# Patient Record
Sex: Male | Born: 1965 | Race: Black or African American | Hispanic: No | Smoking: Former smoker
Health system: Southern US, Community
[De-identification: ages and names within clinical notes are randomized; demographics above are authoritative.]

## PROBLEM LIST (undated history)

## (undated) DIAGNOSIS — F4310 Post-traumatic stress disorder, unspecified: Secondary | ICD-10-CM

## (undated) DIAGNOSIS — I2510 Atherosclerotic heart disease of native coronary artery without angina pectoris: Secondary | ICD-10-CM

## (undated) DIAGNOSIS — M25562 Pain in left knee: Secondary | ICD-10-CM

## (undated) DIAGNOSIS — I10 Essential (primary) hypertension: Secondary | ICD-10-CM

## (undated) DIAGNOSIS — F32A Depression, unspecified: Secondary | ICD-10-CM

## (undated) DIAGNOSIS — E785 Hyperlipidemia, unspecified: Secondary | ICD-10-CM

## (undated) DIAGNOSIS — I209 Angina pectoris, unspecified: Secondary | ICD-10-CM

## (undated) DIAGNOSIS — M25561 Pain in right knee: Secondary | ICD-10-CM

## (undated) DIAGNOSIS — F419 Anxiety disorder, unspecified: Secondary | ICD-10-CM

## (undated) DIAGNOSIS — F329 Major depressive disorder, single episode, unspecified: Secondary | ICD-10-CM

## (undated) HISTORY — DX: Depression, unspecified: F32.A

## (undated) HISTORY — PX: COSMETIC SURGERY: SHX468

## (undated) HISTORY — DX: Pain in left knee: M25.562

## (undated) HISTORY — DX: Major depressive disorder, single episode, unspecified: F32.9

## (undated) HISTORY — DX: Angina pectoris, unspecified: I20.9

## (undated) HISTORY — DX: Anxiety disorder, unspecified: F41.9

## (undated) HISTORY — DX: Essential (primary) hypertension: I10

## (undated) HISTORY — DX: Hyperlipidemia, unspecified: E78.5

## (undated) HISTORY — DX: Pain in right knee: M25.561

---

## 2003-11-02 ENCOUNTER — Emergency Department (HOSPITAL_COMMUNITY): Admission: EM | Admit: 2003-11-02 | Discharge: 2003-11-02 | Payer: Self-pay

## 2003-11-04 ENCOUNTER — Inpatient Hospital Stay (HOSPITAL_COMMUNITY): Admission: EM | Admit: 2003-11-04 | Discharge: 2003-11-07 | Payer: Self-pay

## 2010-06-12 ENCOUNTER — Emergency Department (HOSPITAL_COMMUNITY): Admission: EM | Admit: 2010-06-12 | Discharge: 2010-06-12 | Payer: Self-pay | Admitting: Family Medicine

## 2011-01-08 NOTE — Op Note (Signed)
NAME:  Sean Strong, Sean Strong                       ACCOUNT NO.:  [id number]   MEDICAL RECORD NO.:  [id number]                   PATIENT TYPE:  INP   LOCATION:  5733                                 FACILITY:  [facility]   PHYSICIAN:  Suzanna Obey, M.D.                    DATE OF BIRTH:  Jul 08, 1966   DATE OF PROCEDURE:  11/04/2003  DATE OF DISCHARGE:                                 OPERATIVE REPORT   PREOPERATIVE DIAGNOSIS:  Left preauricular abscess.   POSTOPERATIVE DIAGNOSIS:  Left preauricular abscess.   SURGICAL PROCEDURE:  Incision and drainage of left preauricular abscess.   ANESTHESIA:  General endotracheal anesthesia.   ESTIMATED BLOOD LOSS:  Approximately 10 mL.   INDICATIONS FOR PROCEDURE:  This is a 45 year old who has had a swelling in  the left preauricular region that has increased in size.  He was treated  with some antibiotics earlier in the week but it has progressed into  swelling and pain.  His swelling has extended out to his left lateral orbit.  He has had a previous preauricular pit abscess in the past which had  incision and drainage.  He was informed of the risks and benefits of the  procedure including bleeding, infection, scarring, recurrence, facial nerve  weakness, and risk of the anesthetic.  All questions were answered and  consent was obtained.   OPERATION:  The patient was taken to the operating room and placed in the  supine position.  After adequate general endotracheal anesthesia, was placed  in a right gaze position, prepped and draped in the usual sterile manner.  An incision with a 15 blade was made through the preauricular pit and a  significant amount of purulent foul smelling material was expressed from the  wound.  It was opened with a hemostat to the extent of the abscessed cavity.  This was done with gentle blunt dissection.  The wound was then copiously  irrigated with saline.  A non-adherent Adaptic pack was placed into the  wound.  It was  dressed.  The patient was awakened and brought to the  recovery room in stable condition.  Counts were correct.                                               Suzanna Obey, M.D.    Cordelia Pen  D:  11/04/2003  T:  11/05/2003  Job:  [id number]

## 2011-01-08 NOTE — Discharge Summary (Signed)
NAME:  Sean Strong, Sean Strong                       ACCOUNT NO.:  [id number]   MEDICAL RECORD NO.:  [id number]                   PATIENT TYPE:  INP   LOCATION:  5733                                 FACILITY:  [facility]   PHYSICIAN:  Suzanna Obey, M.D.                    DATE OF BIRTH:  [birth year]   DATE OF ADMISSION:  11/04/2003  DATE OF DISCHARGE:  11/07/2003                                 DISCHARGE SUMMARY   ADMISSION DIAGNOSIS:  Left preauricular abscess.   DISCHARGE DIAGNOSIS:  Left preauricular abscess.   SURGICAL PROCEDURE:  Incision and drainage of abscess.   HISTORY OF PRESENT ILLNESS:  This is a 45 year old who has had an enlarging  mass in his pre-auricular area that has become significantly uncomfortable  and erythematous.  He has had a previous area like this that had to be  incised and this was many years ago.  It is in the location of a pre-  auricular pit.  He underwent the incision and drainage and was admitted for  intravenous antibiotics.   HOSPITAL COURSE:  He was admitted and remained afebrile on postoperative day  #1.  There was still a significant amount of swelling around the left eye  and face.  The packing was in place.  He did well and did notice significant  decrease in his swelling and pain with the antibiotic therapy.  He remained  afebrile.  On postoperative day #3, he was afebrile, feeling well.  The  swelling had completely resolved around the eye and face.  His edema and  erythema around the wound had decreased.  The drain wick was removed.  He  was discharged to home to follow up in one week.  He was discharged on  Augmentin 875 mg b.i.d. for 10 days.  He is to follow up if he has any  increase in swelling, pain, or redness, or fever.                                                Suzanna Obey, M.D.    Cordelia Pen  D:  11/27/2003  T:  11/27/2003  Job:  [id number]

## 2011-03-09 ENCOUNTER — Inpatient Hospital Stay (INDEPENDENT_AMBULATORY_CARE_PROVIDER_SITE_OTHER)
Admission: RE | Admit: 2011-03-09 | Discharge: 2011-03-09 | Disposition: A | Discharge status: Home / Self Care | Payer: Self-pay | Source: Ambulatory Visit | Attending: Family Medicine | Admitting: Family Medicine

## 2011-03-09 DIAGNOSIS — L0201 Cutaneous abscess of face: Secondary | ICD-10-CM

## 2013-07-24 ENCOUNTER — Ambulatory Visit: Payer: Federal, State, Local not specified - PPO

## 2013-07-24 ENCOUNTER — Ambulatory Visit (INDEPENDENT_AMBULATORY_CARE_PROVIDER_SITE_OTHER): Payer: Federal, State, Local not specified - PPO | Admitting: Internal Medicine

## 2013-07-24 VITALS — BP 142/86 | HR 85 | Temp 98.4°F | Resp 17 | Ht 69.0 in | Wt 222.0 lb

## 2013-07-24 DIAGNOSIS — K089 Disorder of teeth and supporting structures, unspecified: Secondary | ICD-10-CM

## 2013-07-24 DIAGNOSIS — M25569 Pain in unspecified knee: Secondary | ICD-10-CM

## 2013-07-24 DIAGNOSIS — R635 Abnormal weight gain: Secondary | ICD-10-CM

## 2013-07-24 DIAGNOSIS — R079 Chest pain, unspecified: Secondary | ICD-10-CM

## 2013-07-24 DIAGNOSIS — F4310 Post-traumatic stress disorder, unspecified: Secondary | ICD-10-CM

## 2013-07-24 LAB — COMPREHENSIVE METABOLIC PANEL
ALT: 14 U/L (ref 0–53)
Alkaline Phosphatase: 59 U/L (ref 39–117)
Sodium: 140 mEq/L (ref 135–145)
Total Bilirubin: 0.7 mg/dL (ref 0.3–1.2)
Total Protein: 7.4 g/dL (ref 6.0–8.3)

## 2013-07-24 LAB — LIPID PANEL
LDL Cholesterol: 167 mg/dL — ABNORMAL HIGH (ref 0–99)
VLDL: 29 mg/dL (ref 0–40)

## 2013-07-24 LAB — POCT CBC
Granulocyte percent: 55.6 %G (ref 37–80)
HCT, POC: 43 % — AB (ref 43.5–53.7)
POC Granulocyte: 2.7 (ref 2–6.9)
POC LYMPH PERCENT: 34.2 %L (ref 10–50)
Platelet Count, POC: 214 10*3/uL (ref 142–424)
RDW, POC: 16.9 %

## 2013-07-24 LAB — POCT SEDIMENTATION RATE: POCT SED RATE: 30 mm/hr — AB (ref 0–22)

## 2013-07-24 NOTE — Progress Notes (Addendum)
 Subjective:  This chart was scribed for Yuleimy Kretz, MD by Matthew Underwood, ED scribe.  This patient was seen in room [facility] Room 9 and the patient's care was started at 10:00 AM.   Patient ID: Sean Strong, male    DOB: 12/11/1965, 47 y.o.   MRN: [id number]  Chief Complaint  Patient presents with  . Chest Pain    HPI!st [facility] OV   HPI Comments: Sean Strong is a 47 y.o. male who presents complaining of one month of intermittent left-sided chest pain.  Pt reports that pain comes on with exertion and is relieved by resting.  It does not occur while lying in bed.  He states the pain initially feels like gas but is more persistent and radiates into both arms down to his hands.  Pain is associated with nausea and a feeling of "foaming at the mouth."  He denies SOB, diaphoresis, or calf pain or swelling.  Pt denies personal h/o heart problems, HTN or DM.  He admits to family h/o heart problems and DM.  He has not received a CXR.  Pt also notes a loose tooth that he needs to have extracted.  He denies dental abscess.  He has dental insurance but does not have a dentist here.  He also complains of bilateral knee pain and "stiffness" that is worse at night.  He has not been seen for this.  Pt is a [employer] veteran [facility]  He takes Zoloft and trazodone for PTSD, insomnia and depression.  He notes that he has gained a large amount of weight over the past 8 months.  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  He also stopped smoking recently.     There are no active problems to display for this patient.   Past Medical History  Diagnosis Date  . Anxiety   . Depression     Past Surgical History  Procedure Laterality Date  . Cosmetic surgery      No Known Allergies   Prior to Admission medications   Medication Sig Start Date End Date Taking? Authorizing Provider  sertraline (ZOLOFT) 100 MG tablet Take 100 mg by mouth  daily.   Yes Historical Provider, MD  traZODone (DESYREL) 100 MG tablet Take 100 mg by mouth at bedtime.   Yes Historical Provider, MD   History    Social History Main Topics  . Smoking status: QUIT  . Smokeless tobacco: Not on file  . Alcohol Use: No  . Drug Use: No  . Sexual Activity: No      Review of Systems  Constitutional: Positive for unexpected weight change (weight gain). Negative for fever and diaphoresis.  HENT: Positive for dental problem.   Respiratory: Negative for shortness of breath.   Cardiovascular: Positive for chest pain. Negative for leg swelling.  Gastrointestinal: Positive for nausea.  Musculoskeletal: Positive for arthralgias.       Objective:   Physical Exam  Nursing note and vitals reviewed. Constitutional: He is oriented to person, place, and time. He appears well-developed and well-nourished. No distress.  HENT:  Head: Normocephalic and atraumatic.  Mouth/Throat: Oropharynx is clear and moist. No oropharyngeal exudate.  Eyes: Conjunctivae and EOM are normal. Pupils are equal, round, and reactive to light.  Neck: No thyromegaly present.  Cardiovascular: Normal rate, regular rhythm and normal heart sounds.   No murmur heard. No carotid bruits  Pulmonary/Chest: Effort normal and breath sounds normal. No respiratory   distress. He has no wheezes. He has no rales.  Musculoskeletal: He exhibits no edema.  Neurological: He is alert and oriented to person, place, and time.  Skin: Skin is warm and dry. No rash noted.  Psychiatric: He has a normal mood and affect. His behavior is normal.     Filed Vitals:   07/24/13 0903  BP: 142/86  Pulse: 85  Temp: 98.4 F (36.9 C)  TempSrc: Oral  Resp: 17  Height: 5' 9" (1.753 m)  Weight: 222 lb (100.699 kg)  SpO2: 95%    [facility] reading (PRIMARY) by  Dr. Therisa Mennella=NAD EKG=ST-T changes suggesting ischemia  Results for orders placed in visit on 07/24/13  POCT CBC      Result Value Range   WBC 4.8  4.6 -  10.2 K/uL   Lymph, poc 1.6  0.6 - 3.4   POC LYMPH PERCENT 34.2  10 - 50 %L   MID (cbc) 0.5  0 - 0.9   POC MID % 10.2  0 - 12 %M   POC Granulocyte 2.7  2 - 6.9   Granulocyte percent 55.6  37 - 80 %G   RBC 5.60  4.69 - 6.13 M/uL   Hemoglobin 13.4 (*) 14.1 - 18.1 g/dL   HCT, POC 43.0 (*) 43.5 - 53.7 %   MCV 76.8 (*) 80 - 97 fL   MCH, POC 23.9 (*) 27 - 31.2 pg   MCHC 31.2 (*) 31.8 - 35.4 g/dL   RDW, POC 16.9     Platelet Count, POC 214  142 - 424 K/uL   MPV 8.3  0 - 99.8 fL       Assessment & Plan:  Chest pain consistent with angina--ref to Dr Ganji for further eval tomorrow Poor dentition-ref to dentist  Labs pending  I have completed the patient encounter in its entirety as documented by the scribe, with editing by me where necessary. Samara Stankowski P. Renne Platts, M.D.   

## 2013-07-24 NOTE — Patient Instructions (Signed)
Dr Yates Decamp cardiologist will call you with appt

## 2013-07-31 ENCOUNTER — Encounter (HOSPITAL_COMMUNITY): Payer: Self-pay | Admitting: General Practice

## 2013-07-31 ENCOUNTER — Ambulatory Visit (HOSPITAL_COMMUNITY)
Admission: RE | Admit: 2013-07-31 | Discharge: 2013-08-01 | Disposition: A | Discharge status: Home / Self Care | Payer: Federal, State, Local not specified - PPO | Source: Ambulatory Visit | Attending: Cardiology | Admitting: Cardiology

## 2013-07-31 ENCOUNTER — Encounter (HOSPITAL_COMMUNITY)
Admission: RE | Disposition: A | Discharge status: Home / Self Care | Payer: Self-pay | Source: Ambulatory Visit | Attending: Cardiology

## 2013-07-31 DIAGNOSIS — Z87891 Personal history of nicotine dependence: Secondary | ICD-10-CM | POA: Insufficient documentation

## 2013-07-31 DIAGNOSIS — F329 Major depressive disorder, single episode, unspecified: Secondary | ICD-10-CM | POA: Insufficient documentation

## 2013-07-31 DIAGNOSIS — Z955 Presence of coronary angioplasty implant and graft: Secondary | ICD-10-CM

## 2013-07-31 DIAGNOSIS — I2510 Atherosclerotic heart disease of native coronary artery without angina pectoris: Secondary | ICD-10-CM | POA: Insufficient documentation

## 2013-07-31 DIAGNOSIS — Z9861 Coronary angioplasty status: Secondary | ICD-10-CM

## 2013-07-31 DIAGNOSIS — I209 Angina pectoris, unspecified: Secondary | ICD-10-CM | POA: Insufficient documentation

## 2013-07-31 DIAGNOSIS — K089 Disorder of teeth and supporting structures, unspecified: Secondary | ICD-10-CM

## 2013-07-31 DIAGNOSIS — E785 Hyperlipidemia, unspecified: Secondary | ICD-10-CM | POA: Insufficient documentation

## 2013-07-31 DIAGNOSIS — F3289 Other specified depressive episodes: Secondary | ICD-10-CM | POA: Insufficient documentation

## 2013-07-31 DIAGNOSIS — F4310 Post-traumatic stress disorder, unspecified: Secondary | ICD-10-CM | POA: Insufficient documentation

## 2013-07-31 DIAGNOSIS — Z7982 Long term (current) use of aspirin: Secondary | ICD-10-CM | POA: Insufficient documentation

## 2013-07-31 HISTORY — DX: Atherosclerotic heart disease of native coronary artery without angina pectoris: I25.10

## 2013-07-31 HISTORY — PX: LEFT HEART CATHETERIZATION WITH CORONARY ANGIOGRAM: SHX5451

## 2013-07-31 HISTORY — PX: PERCUTANEOUS CORONARY STENT INTERVENTION (PCI-S): SHX6016

## 2013-07-31 HISTORY — PX: PERCUTANEOUS CORONARY STENT INTERVENTION (PCI-S): SHX5485

## 2013-07-31 HISTORY — PX: CARDIAC CATHETERIZATION: SHX172

## 2013-07-31 LAB — PROTIME-INR
INR: 1.01 (ref 0.00–1.49)
Prothrombin Time: 13.1 seconds (ref 11.6–15.2)

## 2013-07-31 LAB — POCT I-STAT 3, ART BLOOD GAS (G3+)
Acid-base deficit: 4 mmol/L — ABNORMAL HIGH (ref 0.0–2.0)
Bicarbonate: 20.8 mEq/L (ref 20.0–24.0)
TCO2: 22 mmol/L (ref 0–100)
pH, Arterial: 7.358 (ref 7.350–7.450)

## 2013-07-31 LAB — POCT ACTIVATED CLOTTING TIME
Activated Clotting Time: 222 seconds
Activated Clotting Time: 237 seconds
Activated Clotting Time: 268 seconds

## 2013-07-31 SURGERY — LEFT HEART CATHETERIZATION WITH CORONARY ANGIOGRAM
Anesthesia: LOCAL

## 2013-07-31 MED ORDER — TICAGRELOR 90 MG PO TABS
90.0000 mg | ORAL_TABLET | Freq: Two times a day (BID) | ORAL | Status: DC
  Administered 2013-07-31 – 2013-08-01 (×2): 90 mg via ORAL
  Filled 2013-07-31 (×3): qty 1

## 2013-07-31 MED ORDER — SODIUM CHLORIDE 0.9 % IV SOLN: 250.0000 mL | INTRAVENOUS | Status: DC | PRN

## 2013-07-31 MED ORDER — ATORVASTATIN CALCIUM 40 MG PO TABS
40.0000 mg | ORAL_TABLET | Freq: Every day | ORAL | Status: DC
  Administered 2013-07-31: 40 mg via ORAL
  Filled 2013-07-31 (×2): qty 1

## 2013-07-31 MED ORDER — ACETAMINOPHEN 325 MG PO TABS
650.0000 mg | ORAL_TABLET | ORAL | Status: DC | PRN
  Administered 2013-07-31: 650 mg via ORAL
  Filled 2013-07-31: qty 2

## 2013-07-31 MED ORDER — SODIUM CHLORIDE 0.9 % IV SOLN
INTRAVENOUS | Status: DC
  Administered 2013-07-31: 07:00:00 via INTRAVENOUS

## 2013-07-31 MED ORDER — MIDAZOLAM HCL 2 MG/2ML IJ SOLN
INTRAMUSCULAR | Status: AC
  Filled 2013-07-31: qty 2

## 2013-07-31 MED ORDER — SODIUM CHLORIDE 0.9 % IJ SOLN: 3.0000 mL | INTRAMUSCULAR | Status: DC | PRN

## 2013-07-31 MED ORDER — NITROGLYCERIN 0.2 MG/ML ON CALL CATH LAB
INTRAVENOUS | Status: AC
  Filled 2013-07-31: qty 1

## 2013-07-31 MED ORDER — HEPARIN (PORCINE) IN NACL 2-0.9 UNIT/ML-% IJ SOLN
INTRAMUSCULAR | Status: AC
  Filled 2013-07-31: qty 1000

## 2013-07-31 MED ORDER — ASPIRIN 81 MG PO CHEW
81.0000 mg | CHEWABLE_TABLET | ORAL | Status: AC
  Administered 2013-07-31: 81 mg via ORAL

## 2013-07-31 MED ORDER — NITROGLYCERIN 0.4 MG SL SUBL: 0.4000 mg | SUBLINGUAL_TABLET | SUBLINGUAL | Status: DC | PRN

## 2013-07-31 MED ORDER — HEPARIN SODIUM (PORCINE) 1000 UNIT/ML IJ SOLN
INTRAMUSCULAR | Status: AC
  Filled 2013-07-31: qty 1

## 2013-07-31 MED ORDER — LIDOCAINE HCL (PF) 1 % IJ SOLN
INTRAMUSCULAR | Status: AC
  Filled 2013-07-31: qty 30

## 2013-07-31 MED ORDER — SODIUM CHLORIDE 0.9 % IV SOLN: 1.0000 mL/kg/h | INTRAVENOUS | Status: AC

## 2013-07-31 MED ORDER — SODIUM CHLORIDE 0.9 % IJ SOLN
3.0000 mL | Freq: Two times a day (BID) | INTRAMUSCULAR | Status: DC
  Administered 2013-07-31: 18:00:00 3 mL via INTRAVENOUS

## 2013-07-31 MED ORDER — TRAZODONE HCL 100 MG PO TABS
100.0000 mg | ORAL_TABLET | Freq: Every day | ORAL | Status: DC
  Administered 2013-07-31: 100 mg via ORAL
  Filled 2013-07-31 (×2): qty 1

## 2013-07-31 MED ORDER — CARVEDILOL 6.25 MG PO TABS
6.2500 mg | ORAL_TABLET | Freq: Two times a day (BID) | ORAL | Status: DC
  Administered 2013-07-31 – 2013-08-01 (×2): 6.25 mg via ORAL
  Filled 2013-07-31 (×4): qty 1

## 2013-07-31 MED ORDER — ASPIRIN 81 MG PO CHEW
81.0000 mg | CHEWABLE_TABLET | Freq: Every day | ORAL | Status: DC
  Administered 2013-08-01: 12:00:00 81 mg via ORAL
  Filled 2013-07-31: qty 1

## 2013-07-31 MED ORDER — SODIUM CHLORIDE 0.9 % IJ SOLN: 3.0000 mL | Freq: Two times a day (BID) | INTRAMUSCULAR | Status: DC

## 2013-07-31 MED ORDER — TICAGRELOR 90 MG PO TABS
90.0000 mg | ORAL_TABLET | Freq: Two times a day (BID) | ORAL | Status: DC
  Filled 2013-07-31: qty 1

## 2013-07-31 MED ORDER — TICAGRELOR 90 MG PO TABS
ORAL_TABLET | ORAL | Status: AC
  Filled 2013-07-31: qty 1

## 2013-07-31 MED ORDER — VERAPAMIL HCL 2.5 MG/ML IV SOLN
INTRAVENOUS | Status: AC
  Filled 2013-07-31: qty 2

## 2013-07-31 MED ORDER — ONDANSETRON HCL 4 MG/2ML IJ SOLN: 4.0000 mg | Freq: Four times a day (QID) | INTRAMUSCULAR | Status: DC | PRN

## 2013-07-31 MED ORDER — HYDROMORPHONE HCL PF 2 MG/ML IJ SOLN
INTRAMUSCULAR | Status: AC
  Filled 2013-07-31: qty 1

## 2013-07-31 MED ORDER — ADENOSINE 12 MG/4ML IV SOLN
16.0000 mL | Freq: Once | INTRAVENOUS | Status: DC
  Filled 2013-07-31: qty 16

## 2013-07-31 MED ORDER — ALPRAZOLAM 0.25 MG PO TABS
0.2500 mg | ORAL_TABLET | Freq: Two times a day (BID) | ORAL | Status: DC | PRN
  Administered 2013-07-31: 13:00:00 0.25 mg via ORAL
  Filled 2013-07-31: qty 1

## 2013-07-31 MED ORDER — ASPIRIN 81 MG PO CHEW
CHEWABLE_TABLET | ORAL | Status: AC
  Filled 2013-07-31: qty 1

## 2013-07-31 NOTE — H&P (View-Only) (Signed)
Subjective:  This chart was scribed for Sean Sia, MD by Sean Strong, ED scribe.  This patient was seen in room [facility] Room 9 and the patient's care was started at 10:00 AM.   Patient ID: Sean Strong, male    DOB: Dec 17, 1965, 47 y.o.   MRN: [id number]  Chief Complaint  Patient presents with  . Chest Pain    HPI!st [facility] OV   HPI Comments: Sean Strong is a 47 y.o. male who presents complaining of one month of intermittent left-sided chest pain.  Pt reports that pain comes on with exertion and is relieved by resting.  It does not occur while lying in bed.  He states the pain initially feels like gas but is more persistent and radiates into both arms down to his hands.  Pain is associated with nausea and a feeling of "foaming at the mouth."  He denies SOB, diaphoresis, or calf pain or swelling.  Pt denies personal h/o heart problems, HTN or DM.  He admits to family h/o heart problems and DM.  He has not received a CXR.  Pt also notes a loose tooth that he needs to have extracted.  He denies dental abscess.  He has dental insurance but does not have a dentist here.  He also complains of bilateral knee pain and "stiffness" that is worse at night.  He has not been seen for this.  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  He takes Zoloft and trazodone for PTSD, insomnia and depression.  He notes that he has gained a large amount of weight over the past 8 months.  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  He also stopped smoking recently.     There are no active problems to display for this patient.   Past Medical History  Diagnosis Date  . Anxiety   . Depression     Past Surgical History  Procedure Laterality Date  . Cosmetic surgery      No Known Allergies   Prior to Admission medications   Medication Sig Start Date End Date Taking? Authorizing Provider  sertraline (ZOLOFT) 100 MG tablet Take 100 mg by mouth  daily.   Yes Historical Provider, MD  traZODone (DESYREL) 100 MG tablet Take 100 mg by mouth at bedtime.   Yes Historical Provider, MD   History    Social History Main Topics  . Smoking status: QUIT  . Smokeless tobacco: Not on file  . Alcohol Use: No  . Drug Use: No  . Sexual Activity: No      Review of Systems  Constitutional: Positive for unexpected weight change (weight gain). Negative for fever and diaphoresis.  HENT: Positive for dental problem.   Respiratory: Negative for shortness of breath.   Cardiovascular: Positive for chest pain. Negative for leg swelling.  Gastrointestinal: Positive for nausea.  Musculoskeletal: Positive for arthralgias.       Objective:   Physical Exam  Nursing note and vitals reviewed. Constitutional: He is oriented to person, place, and time. He appears well-developed and well-nourished. No distress.  HENT:  Head: Normocephalic and atraumatic.  Mouth/Throat: Oropharynx is clear and moist. No oropharyngeal exudate.  Eyes: Conjunctivae and EOM are normal. Pupils are equal, round, and reactive to light.  Neck: No thyromegaly present.  Cardiovascular: Normal rate, regular rhythm and normal heart sounds.   No murmur heard. No carotid bruits  Pulmonary/Chest: Effort normal and breath sounds normal. No respiratory  distress. He has no wheezes. He has no rales.  Musculoskeletal: He exhibits no edema.  Neurological: He is alert and oriented to person, place, and time.  Skin: Skin is warm and dry. No rash noted.  Psychiatric: He has a normal mood and affect. His behavior is normal.     Filed Vitals:   07/24/13 0903  BP: 142/86  Pulse: 85  Temp: 98.4 F (36.9 C)  TempSrc: Oral  Resp: 17  Height: 5\' 9"  (1.753 m)  Weight: 222 lb (100.699 kg)  SpO2: 95%    [facility] reading (PRIMARY) by  Sean. Londen Strong=NAD EKG=ST-T changes suggesting ischemia  Results for orders placed in visit on 07/24/13  POCT CBC      Result Value Range   WBC 4.8  4.6 -  10.2 K/uL   Lymph, poc 1.6  0.6 - 3.4   POC LYMPH PERCENT 34.2  10 - 50 %L   MID (cbc) 0.5  0 - 0.9   POC MID % 10.2  0 - 12 %M   POC Granulocyte 2.7  2 - 6.9   Granulocyte percent 55.6  37 - 80 %G   RBC 5.60  4.69 - 6.13 M/uL   Hemoglobin 13.4 (*) 14.1 - 18.1 g/dL   HCT, POC 16.1 (*) 09.6 - 53.7 %   MCV 76.8 (*) 80 - 97 fL   MCH, POC 23.9 (*) 27 - 31.2 pg   MCHC 31.2 (*) 31.8 - 35.4 g/dL   RDW, POC 04.5     Platelet Count, POC 214  142 - 424 K/uL   MPV 8.3  0 - 99.8 fL       Assessment & Plan:  Chest pain consistent with angina--ref to Sean Strong for further eval tomorrow Poor dentition-ref to dentist  Labs pending  I have completed the patient encounter in its entirety as documented by the scribe, with editing by me where necessary. Sean Strong P. Sean Strong, M.D.

## 2013-07-31 NOTE — Progress Notes (Signed)
TR BAND REMOVAL  LOCATION:    right radial  DEFLATED PER PROTOCOL:    yes  TIME BAND OFF / DRESSING APPLIED:    1235   SITE UPON ARRIVAL:    Level 0  SITE AFTER BAND REMOVAL:    Level 0  REVERSE ALLEN'S TEST:     positive  CIRCULATION SENSATION AND MOVEMENT:    Within Normal Limits   yes  COMMENTS:   Tolerated procedure well

## 2013-07-31 NOTE — CV Procedure (Signed)
Procedure performed:  Left heart catheterization including hemodynamic monitoring of the left ventricle, LV gram. Selective right and left coronary arteriography. PTCA and stenting of the proximal and mid LAD with 2 overlapping 4.0 x 20 mm and a 3.5 x 32 mm promos Premier drug-eluting stent. Evaluation of mid LAD stenosis significance by FFR.  Indication: Patient is a 47 year-old [place] American male with history of post traumatic stress disorder, hyperlipidemia was not on any therapy  who presents with exertional angina pectoris of class III,. Patient has  had non invasive testing which was markedly abnormal revealing severe anterior and anteroapical ischemia. With a suspicion of proximal LAD high-grade stenosis, brought to the cardiac catheterization lab to evaluate  coronary anatomy for definitive diagnosis of CAD.  Hemodynamic data: Left ventricular pressure was 106/3 with LVEDP of 10 mm mercury. Aortic pressure was 99/75 with a mean of 87 mm mercury. There was no pressure gradient across the aortic valve.   Left ventricle: Performed in the RAO projection revealed LVEF of 55%. There was NO MR. no wall motion abnormality.  Right coronary artery: The vessel is smooth, normal, Dominant.  Left main coronary artery is large and normal.  Circumflex coronary artery: A large vessel giving origin to a large obtuse marginal 1. There is a moderate sized OM 2, large AV groove branch, no significant stenosis.  LAD:  LAD gives origin to a large diagonal-1.  At the origin of the septal perforator 1, there is a high-grade 99% stenosis of the proximal LAD. The stenosis extends beyond the diagonal 1, and distal to this there is a hazy 50% stenosis in the mid LAD. Mid to distal segment of the LAD shows very mild luminal irregularity.  Impression: High-grade stenosis of the proximal LAD, mid LAD shows a 50% hazy stenosis. The diagonal 1 has mild ostial stenosis of 20%, diagonal 3 has ostial stenosis of 80%,  however it is very small.  Interventional data: Successful PTCA and stenting of the  proximal and mid LAD with 2 overlapping 4.0 x 20 mm and a 3.5 x 32 mm promos Premier drug-eluting stent.  Will need Dual antiplatelet therapy with BRILINTA and ASA 81 mg for at least one year.   Technique of diagnostic cardiac catheterization:  Under sterile precautions using a 6 French right radial  arterial access, a 6 French sheath was introduced into the right radial artery. A 5 [place] Tig 4 catheter was advanced into the ascending aorta selective  right coronary artery and left coronary artery was cannulated and angiography was performed in multiple views. The catheter was pulled back Out of the body over exchange length J-wire.  Same Catheter was used to perform LV gram which was performed in RAO projection. Catheter exchanged out of the body over J-Wire. NO immediate complications noted.  Technique of intervention:  Using a 6 [place] XB LAD 6 [place] guide catheter the left main  coronary  was selected and cannulated. Using heparin IV for anticoagulation, I utilized a cougar XT guidewire and across the left anterior descending coronary artery without any difficulty. I placed the tip of the wire into the distal  coronary artery. Angiography was performed. Then I utilized a 2.5 x 18 mm sprinter Legend balloon , I performed balloon angioplasty at 14 atmospheric pressure x 60 for seconds each.   I proceeded with implantation of a 4.0 x20 mm promos Premier  drug-eluting stent into the proximal LAD coronary artery. The stent was deployed at 10 atmospheric pressure for 69  seconds. After stenting the proximal LAD, multiple views, angiograms was performed to evaluate the mid LAD stenosis. The guidewire was withdrawn, intracoronary nitroglycerin was administered. The mid LAD stenosis appeared to be significant in the AP cranial views at least by 50-60%. Due to the haziness,  decided to proceed with evaluation of the mid LAD  stenosis by FFR in the standard fashion.  ACT was maintained at therapeutic range throughout the procedure. The cougar guidewire was withdrawn and I then introduced Prime wire into the proximal LAD, and then the gently advanced the wire into the distal LAD with moderate amount of difficulty. Immediately after crossing the LAD stenosis the FFR fell from 1.0 to 0.7 , hence the lesion was felt significant even without administration of intravenous adenosine. At this point we decided to stent the mid LAD stenosis and a previously used 2.5 x 15 mm sprinter Legend balloon was advanced into the mid LAD to evaluate the size of the stent that would be needed. The balloon was withdrawn, I then withdrew the Prime wire out of the body due to inability to advance the stent. I readvanced cougar XT guidewire back into the LAD and rest of the procedure was uneventful , I then advanced a 3.5 x 32 mm promos Premier drug-eluting stent into the mid LAD and overlapping with the previously placed proximal LAD stent, the stent was deployed at 8 atmospheric pressure for 60 seconds followed by withdrawal of the same stent balloon just proximal into the stent and overlapping with the previously placed stent, I balloon it at 14 atmospheric pressure for 45 seconds. Intracoronary nitroglycerin was administered. Following this angiography revealed small diagonal 3 branch which had high-grade stenosis to begin with, had very slow flow, however at the end of the procedure the flow improved to TIMI-2 flow which is suspect will improve TIMI-3 flow. The LAD itself looked excellent without any edge dissection. There was no evidence of thrombus.  The guidewire was withdrawn out of the body and the guide catheter was engaged and pulled out of the body over the J-wire the was no immediate complication. Patient tolerated the procedure well. Hemostasis was obtained by applying TR band.  Disposition: Patient will be discharged in morning unless  complications with out-patient follow up.

## 2013-07-31 NOTE — Care Management Note (Addendum)
    Page 1 of 1   08/01/2013     2:18:37 PM   CARE MANAGEMENT NOTE 08/01/2013  Patient:  Sean Strong, Sean Strong   Account Number:  [id number]  Date Initiated:  07/31/2013  Documentation initiated by:  Gi Or Norman  Subjective/Objective Assessment:   47 y.o. male who presents complaining of one month of intermittent left-sided chest pain. //Home with spouse     Action/Plan:   Left heart cath//benefits check for Brilinta.   Anticipated DC Date:  07/31/2013   Anticipated DC Plan:  HOME/SELF CARE      DC Planning Services  CM consult  Medication Assistance      Choice offered to / List presented to:             Status of service:   Medicare Important Message given?   (If response is "NO", the following Medicare IM given date fields will be blank) Date Medicare IM given:   Date Additional Medicare IM given:    Discharge Disposition:    Per UR Regulation:    If discussed at Long Length of Stay Meetings, dates discussed:    Comments:  08/01/13 1130 Oletta Cohn, RN, BSN, NCM [phone] Pt V.A.and utilizes services in [place].  Pt instructed to follow-up with PCP at [facility] to get new Brilinta prescription. 30 day free card will cover pt until he gets to PCP at [facility].  07/31/13 1430 Camellia Wood, RN, BSN, [employer] [phone] Brilinta or the generic of Marden Noble is not covered under member's policy.  However, Plavix and the generic of that is Clopidogrel is covered.  Plavix is $80/copay for 30 day supply and does need auth - number to call is [phone]. Clopidogrel is $15.00 / for either a 30 day supply or a mail order (90day supply).  No prior auth for this one.

## 2013-07-31 NOTE — Interval H&P Note (Signed)
Please see my ov notes also.   History and Physical Interval Note:  07/31/2013 7:46 AM  Sean Strong  has presented today for surgery, with the diagnosis of cad  The various methods of treatment have been discussed with the patient and family. After consideration of risks, benefits and other options for treatment, the patient has consented to  Procedure(s): LEFT HEART CATHETERIZATION WITH CORONARY ANGIOGRAM (N/A) and possible PCI as a surgical intervention .  The patient's history has been reviewed, patient examined, no change in status, stable for surgery.  I have reviewed the patient's chart and labs.  Questions were answered to the patient's satisfaction.   Cath Lab Visit (complete for each Cath Lab visit)  Clinical Evaluation Leading to the Procedure:   ACS: no  Non-ACS:    Anginal Classification: CCS III  Anti-ischemic medical therapy: Maximal Therapy (2 or more classes of medications)  Non-Invasive Test Results: High-risk stress test findings: cardiac mortality >3%/year  Prior CABG: No previous CABG        [facility] R

## 2013-08-01 LAB — CBC
MCH: 24.8 pg — ABNORMAL LOW (ref 26.0–34.0)
Platelets: 202 10*3/uL (ref 150–400)
RBC: 5.4 MIL/uL (ref 4.22–5.81)
RDW: 14.9 % (ref 11.5–15.5)
WBC: 6.2 10*3/uL (ref 4.0–10.5)

## 2013-08-01 LAB — BASIC METABOLIC PANEL
BUN: 9 mg/dL (ref 6–23)
CO2: 21 mEq/L (ref 19–32)
Calcium: 9.2 mg/dL (ref 8.4–10.5)
Chloride: 107 mEq/L (ref 96–112)
Creatinine, Ser: 0.99 mg/dL (ref 0.50–1.35)
GFR calc Af Amer: >90 mL/min (ref >90)
GFR calc non Af Amer: >90 mL/min (ref >90)
Glucose, Bld: 98 mg/dL (ref 70–99)
Sodium: 138 mEq/L (ref 135–145)

## 2013-08-01 MED ORDER — TICAGRELOR 90 MG PO TABS: 90.0000 mg | ORAL_TABLET | Freq: Two times a day (BID) | ORAL | Status: DC

## 2013-08-01 NOTE — Discharge Summary (Addendum)
Physician Discharge Summary  Patient ID: Sean Strong MRN: [id number] DOB/AGE: 1966-06-24 47 y.o.  Admit date: 07/31/2013 Discharge date: 08/01/2013 PCP: Ellamae Sia, MD  Primary Discharge Diagnosis CAD of the native vessel S/P PTCA one vessel, 07/31/2013: PTCA and stenting of the proximal and mid LAD with 2 overlapping 4.0 x 20 mm and a 3.5 x 32 mm promos Premier drug-eluting stent.  Secondary Discharge Diagnosis Hyperlipidemia PTSD  Significant Diagnostic Studies: 07/01/2013: Left ventricle: Performed in the RAO projection revealed LVEF of 55%. There was NO MR. no wall motion abnormality.  Right coronary artery: The vessel is smooth, normal, Dominant.  Left main coronary artery is large and normal.  Circumflex coronary artery: A large vessel giving origin to a large obtuse marginal 1. There is a moderate sized OM 2, large AV groove branch, no significant stenosis.  LAD: LAD gives origin to a large diagonal-1. At the origin of the septal perforator 1, there is a high-grade 99% stenosis of the proximal LAD. The stenosis extends beyond the diagonal 1, and distal to this there is a hazy 50% stenosis in the mid LAD. Mid to distal segment of the LAD shows very mild luminal irregularity.  Impression: High-grade stenosis of the proximal LAD, mid LAD shows a 50% hazy stenosis. The diagonal 1 has mild ostial stenosis of 20%, diagonal 3 has ostial stenosis of 80%, however it is very small.  Interventional data: Successful PTCA and stenting of the proximal and mid LAD with 2 overlapping 4.0 x 20 mm and a 3.5 x 32 mm promos Premier drug-eluting stent. Will need Dual antiplatelet therapy with BRILINTA and ASA 81 mg for at least one year. Mid LAD stenting performed guided by FFR which was 0.70 immediately after crossing the mid LAD stent after proximal LAD was stented.  Hospital Course: Patient was evaluated in the outpatient setting for chest discomfort, underwent recent stress testing 3 days ago  on 07/27/2013 and was found to have severe antral wall ischemia. He was electively scheduled for coronary angiography, found to have high-grade stenosis in the proximal and mid LAD, underwent successful angioplasty. This morning patient states that he heard he feels better, feels like in his chest, slept well. His right radial arterial access site was stable. Hence was felt stable for discharge.  He has insurance issues with BRILINTA, I will give him 30 day supply, will work on getting him the medication through [facility] as he is a veteran, I will follow this closely. He'll keep up his appointment with me in 2 weeks.  Discharge Exam: Blood pressure 135/77, pulse 78, temperature 98.4 F (36.9 C), temperature source Oral, resp. rate 18, height 5\' 9"  (1.753 m), weight 101 kg (222 lb 10.6 oz), SpO2 98.00%.  General appearance: alert, cooperative, appears stated age and no distress Resp: clear to auscultation bilaterally Cardio: regular rate and rhythm, S1, S2 normal, no murmur, click, rub or gallop GI: soft, non-tender; bowel sounds normal; no masses,  no organomegaly Extremities: extremities normal, atraumatic, no cyanosis or edema and Right radial arterial access site has healed well. No hematoma. Good pulse evident. Neurologic: Grossly normal Labs:   Lab Results  Component Value Date   WBC 6.2 08/01/2013   HGB 13.4 08/01/2013   HCT 38.4* 08/01/2013   MCV 71.1* 08/01/2013   PLT 202 08/01/2013    Recent Labs Lab 08/01/13 0558  NA 138  K 3.6  CL 107  CO2 21  BUN 9  CREATININE 0.99  CALCIUM 9.2  GLUCOSE  98    Lipid Panel     Component Value Date/Time   CHOL 230* 07/24/2013 1025   TRIG 146 07/24/2013 1025   HDL 34* 07/24/2013 1025   CHOLHDL 6.8 07/24/2013 1025   VLDL 29 07/24/2013 1025   LDLCALC 167* 07/24/2013 1025    EKG: Normal sinus rhythm, normal intervals, nonspecific T. abnormality anterior leads.    Radiology: Dg Chest 2 View  07/24/2013   CLINICAL DATA:  Chest pain   EXAM: CHEST  2 VIEW  COMPARISON:  None.  FINDINGS: Lungs are clear. Heart size and pulmonary vascularity are normal. No adenopathy. No pneumothorax. No bone lesions.  IMPRESSION: No abnormality noted.   Electronically Signed   By: Bretta Bang M.D.   On: 07/24/2013 11:43    FOLLOW UP PLANS AND APPOINTMENTS Discharge Orders   Future Orders Complete By Expires   Amb Referral to Cardiac Rehabilitation  As directed        Medication List         aspirin 81 MG chewable tablet  Chew 81 mg by mouth daily.     atorvastatin 40 MG tablet  Commonly known as:  LIPITOR  Take 40 mg by mouth daily.     carvedilol 6.25 MG tablet  Commonly known as:  COREG  Take 6.25 mg by mouth 2 (two) times daily with a meal.     nitroGLYCERIN 0.4 MG SL tablet  Commonly known as:  NITROSTAT  Place 0.4 mg under the tongue every 5 (five) minutes as needed for chest pain.     Ticagrelor 90 MG Tabs tablet  Commonly known as:  BRILINTA  Take 1 tablet (90 mg total) by mouth 2 (two) times daily.     traZODone 100 MG tablet  Commonly known as:  DESYREL  Take 100 mg by mouth at bedtime.          Pamella Pert, MD 08/01/2013, 5:20 PM  Pager: [phone] Office: [phone] If no answer: [phone]

## 2013-08-01 NOTE — Progress Notes (Signed)
CARDIAC REHAB PHASE I   PRE:  Rate/Rhythm: 83SR  BP:  Supine:   Sitting: 150/91  Standing:    SaO2:   MODE:  Ambulation: 650 ft   POST:  Rate/Rhythm: 83SR  BP:  Supine:   Sitting: 146/83   Standing:    SaO2:  0920-1017 Pt walked 650 ft with slow steady gait. Tired by end of walk. Has not been able to walk much lately due to feeling bad. Denied CP. Pt discussed PTSD and emotional support given. Feel that pt would really benefit from CRP 2 and he is in agreement for referral. Will refer to [place]. Education completed. Pt knows that he needs to work on diet and ex as he has gained a lot of weight over short period of time. Gave heart healthy diet and ex ed. States ready to make changes.   Luetta Nutting, RN BSN  08/01/2013 10:13 AM

## 2013-08-21 ENCOUNTER — Encounter (HOSPITAL_COMMUNITY)
Admission: RE | Admit: 2013-08-21 | Discharge: 2013-08-21 | Disposition: A | Discharge status: Home / Self Care | Payer: Federal, State, Local not specified - PPO | Source: Ambulatory Visit | Attending: Cardiology | Admitting: Cardiology

## 2013-08-21 NOTE — Progress Notes (Signed)
Cardiac Rehab Medication Review by a Pharmacist  Does the patient  feel that his/her medications are working for him/her?  yes  Has the patient been experiencing any side effects to the medications prescribed?  yes  Does the patient measure his/her own blood pressure or blood glucose at home?  no   Does the patient have any problems obtaining medications due to transportation or finances?   no  Understanding of regimen: good Understanding of indications: good Potential of compliance: good   Pharmacist comments: Sean Strong is a pleasant 47 yo AAM who has a good grasp on his medications and why he takes them.  The only side effects he reports are now resolved.  He had some initial headaches and nosebleeds after his procedure, but none now.  One week ago, he had some sharp pain in his left hand, but that has also resolved.  He will be addressing with the physician about restarting Zoloft 100mg  qAM upon his next visit, but does not currently take.  He is a patient of the [facility] and gets part of his medications through there pharmacy.  He does not monitor his BP at home.  Sean Strong, PharmD Clinical Pharmacist - Resident Pager: [phone] Pharmacy: [phone] 08/21/2013 8:43 AM

## 2013-08-27 ENCOUNTER — Ambulatory Visit (HOSPITAL_COMMUNITY): Payer: Federal, State, Local not specified - PPO

## 2013-08-27 ENCOUNTER — Encounter (HOSPITAL_COMMUNITY)
Admission: RE | Admit: 2013-08-27 | Discharge: 2013-08-27 | Disposition: A | Discharge status: Home / Self Care | Payer: Federal, State, Local not specified - PPO | Source: Ambulatory Visit | Attending: Cardiology | Admitting: Cardiology

## 2013-08-27 ENCOUNTER — Encounter (HOSPITAL_COMMUNITY): Payer: Self-pay

## 2013-08-27 DIAGNOSIS — Z7982 Long term (current) use of aspirin: Secondary | ICD-10-CM | POA: Insufficient documentation

## 2013-08-27 DIAGNOSIS — Z87891 Personal history of nicotine dependence: Secondary | ICD-10-CM | POA: Insufficient documentation

## 2013-08-27 DIAGNOSIS — I2510 Atherosclerotic heart disease of native coronary artery without angina pectoris: Secondary | ICD-10-CM | POA: Insufficient documentation

## 2013-08-27 DIAGNOSIS — I209 Angina pectoris, unspecified: Secondary | ICD-10-CM | POA: Insufficient documentation

## 2013-08-27 DIAGNOSIS — E785 Hyperlipidemia, unspecified: Secondary | ICD-10-CM | POA: Insufficient documentation

## 2013-08-27 DIAGNOSIS — Z5189 Encounter for other specified aftercare: Secondary | ICD-10-CM | POA: Insufficient documentation

## 2013-08-27 NOTE — Progress Notes (Signed)
Pt started cardiac rehab today.  Pt tolerated light exercise without difficulty.  VSS, telemetry-NSR.  Asymptomatic.  Pt oriented to exercise equipment and routine.  Understanding verbalized.  PSYCHOSOCIAL ASSESSMENT  Pt psychosocial assessment reveals no barriers to rehab participation.  Pt quality of life is altered by his physical and mental ability to perform tasks as prior to his illness.  Pt is currently being treated for PTSD and depression by [facility].    Pt exhibits positive coping skills, however lacks family support.   PHQ-11.  Pt reports he is scheduled to resume his zoloft 100mg  however he does not like how it makes him feel.  Pt advised to contact mental health provider to discuss medication dosage.  Dr. Jacinto HalimGanji made aware of QOL scores and pt desire to take ED medication.  Offered emotional support and reassurance.  Will continue to monitor.

## 2013-08-29 ENCOUNTER — Encounter (HOSPITAL_COMMUNITY)
Admission: RE | Admit: 2013-08-29 | Discharge: 2013-08-29 | Disposition: A | Discharge status: Home / Self Care | Payer: Federal, State, Local not specified - PPO | Source: Ambulatory Visit | Attending: Cardiology | Admitting: Cardiology

## 2013-08-29 ENCOUNTER — Ambulatory Visit (HOSPITAL_COMMUNITY): Payer: Federal, State, Local not specified - PPO

## 2013-08-29 NOTE — Progress Notes (Signed)
Pt arrived at cardiac rehab today reporting he is out of carvedilol and brilinta.  Last dose yesterday. Pt reports he has requested refills but not ready. [facility]. Pharmacist reports they are out of stock of Brilinta, will not be available until tomorrow.  The carvedilol is ready for pick up.  pc to Dr. Verl DickerGanji's nurse to arrange for pt to pick up samples of brilinta in the office.  Pt instructed to pick up samples from Dr. Jacinto HalimGanji and pick up carvidilol at pharmacy.  Understanding verbalized

## 2013-08-31 ENCOUNTER — Encounter (HOSPITAL_COMMUNITY)
Admission: RE | Admit: 2013-08-31 | Discharge: 2013-08-31 | Disposition: A | Discharge status: Home / Self Care | Payer: Federal, State, Local not specified - PPO | Source: Ambulatory Visit | Attending: Cardiology | Admitting: Cardiology

## 2013-08-31 ENCOUNTER — Ambulatory Visit (HOSPITAL_COMMUNITY): Payer: Federal, State, Local not specified - PPO

## 2013-08-31 NOTE — Progress Notes (Signed)
[facility] brilinta is ready for pick up. Understanding verbalized

## 2013-08-31 NOTE — Progress Notes (Signed)
Sean Strong says he has had 2 nose bleeds at home this week. Patient reports the  nose bleeds as mild. No problems noted today with exercise at cardiac rehab. Dr Verl DickerGanji's office called and notified.

## 2013-09-03 ENCOUNTER — Ambulatory Visit (HOSPITAL_COMMUNITY): Payer: Federal, State, Local not specified - PPO

## 2013-09-03 ENCOUNTER — Encounter (HOSPITAL_COMMUNITY)
Admission: RE | Admit: 2013-09-03 | Discharge: 2013-09-03 | Disposition: A | Discharge status: Home / Self Care | Payer: Federal, State, Local not specified - PPO | Source: Ambulatory Visit | Attending: Cardiology | Admitting: Cardiology

## 2013-09-04 NOTE — Progress Notes (Addendum)
(  late entry for 09/03/13 1:30pm)  Pt arrived at cardiac rehab reporting he was unable to pick up his brilinta due to $45 copay.  Pt would like to check with his VA benefits to see if they will cover the cost.  Pt states he has enough samples to last a few more days from Dr. Jacinto HalimGanji.  Will continue to monitor.  Spoke to April Dr. Verl DickerGanji's nurse.  Samples are available for pt to pick up at Dr. Verl DickerGanji's office. Pt has appt with Dr. Jacinto HalimGanji later this week and can discuss changing to more affordable option at that time.

## 2013-09-05 ENCOUNTER — Ambulatory Visit (HOSPITAL_COMMUNITY): Payer: Federal, State, Local not specified - PPO

## 2013-09-05 ENCOUNTER — Encounter (HOSPITAL_COMMUNITY)
Admission: RE | Admit: 2013-09-05 | Discharge: 2013-09-05 | Disposition: A | Discharge status: Home / Self Care | Payer: Federal, State, Local not specified - PPO | Source: Ambulatory Visit | Attending: Cardiology | Admitting: Cardiology

## 2013-09-05 NOTE — Progress Notes (Signed)
[name] is supposed to go by Dr Jodi MarbleGangi's office after exercise and pick up Brillinta samples after exercise at cardiac rehab.

## 2013-09-07 ENCOUNTER — Encounter (HOSPITAL_COMMUNITY)
Admission: RE | Admit: 2013-09-07 | Discharge: 2013-09-07 | Disposition: A | Discharge status: Home / Self Care | Payer: Federal, State, Local not specified - PPO | Source: Ambulatory Visit | Attending: Cardiology | Admitting: Cardiology

## 2013-09-07 ENCOUNTER — Ambulatory Visit (HOSPITAL_COMMUNITY): Payer: Federal, State, Local not specified - PPO

## 2013-09-10 ENCOUNTER — Ambulatory Visit (HOSPITAL_COMMUNITY): Payer: Federal, State, Local not specified - PPO

## 2013-09-10 ENCOUNTER — Encounter (HOSPITAL_COMMUNITY)
Admission: RE | Admit: 2013-09-10 | Discharge: 2013-09-10 | Disposition: A | Discharge status: Home / Self Care | Payer: Federal, State, Local not specified - PPO | Source: Ambulatory Visit | Attending: Cardiology | Admitting: Cardiology

## 2013-09-10 NOTE — Progress Notes (Signed)
Pt arrived at cardiac rehab reporting increasing knee pain at rest and with exertion. Pt reports he awoke Saturday morning with increased knee pain, more than usual.  No swelling, edema or redness noted.  Negative homans sign.  Strong pedal and post tibular pulses.  Pt reports increased pain with walking and stair climbing. Pt unable to participate in exercise today.  Pt declined offer to call Dr. Jacinto HalimGanji for appt.  Pt states he plans to [facility], probably tomorrow am when he has copayment available.  Pt instructed to present to ED for sudden worsening discomfort.  Understanding verbalized. Pt also states that he was able to get VA authorization to pay for Brilinta. Pt states he has enough brilinta available to last until the supply arrives from the [facility] pharmacy. Pt instructed to notify Dr. Jacinto HalimGanji if needs brilinta samples, reminded importance of avoiding missed brilinta doses.  Understanding verbalized

## 2013-09-11 ENCOUNTER — Ambulatory Visit (INDEPENDENT_AMBULATORY_CARE_PROVIDER_SITE_OTHER): Payer: Federal, State, Local not specified - PPO | Admitting: Internal Medicine

## 2013-09-11 ENCOUNTER — Ambulatory Visit: Payer: Federal, State, Local not specified - PPO

## 2013-09-11 VITALS — BP 100/60 | HR 80 | Temp 98.2°F | Resp 16 | Ht 68.5 in | Wt 223.0 lb

## 2013-09-11 DIAGNOSIS — M79605 Pain in left leg: Secondary | ICD-10-CM

## 2013-09-11 DIAGNOSIS — IMO0002 Reserved for concepts with insufficient information to code with codable children: Secondary | ICD-10-CM

## 2013-09-11 DIAGNOSIS — M1710 Unilateral primary osteoarthritis, unspecified knee: Secondary | ICD-10-CM

## 2013-09-11 DIAGNOSIS — M79604 Pain in right leg: Secondary | ICD-10-CM

## 2013-09-11 DIAGNOSIS — M79609 Pain in unspecified limb: Secondary | ICD-10-CM

## 2013-09-11 MED ORDER — NAPROXEN 500 MG PO TABS: 500.0000 mg | ORAL_TABLET | Freq: Two times a day (BID) | ORAL | Status: DC

## 2013-09-11 MED ORDER — METHYLPREDNISOLONE ACETATE 80 MG/ML IJ SUSP
120.0000 mg | Freq: Once | INTRAMUSCULAR | Status: AC
  Administered 2013-09-11: 120 mg via INTRAMUSCULAR

## 2013-09-11 NOTE — Patient Instructions (Signed)
Knee Exercises EXERCISES RANGE OF MOTION(ROM) AND STRETCHING EXERCISES These exercises may help you when beginning to rehabilitate your injury. Your symptoms may resolve with or without further involvement from your physician, physical therapist or athletic trainer. While completing these exercises, remember:   Restoring tissue flexibility helps normal motion to return to the joints. This allows healthier, less painful movement and activity.  An effective stretch should be held for at least 30 seconds.  A stretch should never be painful. You should only feel a gentle lengthening or release in the stretched tissue. STRETCH - Knee Extension, Prone  Lie on your stomach on a firm surface, such as a bed or countertop. Place your right / left knee and leg just beyond the edge of the surface. You may wish to place a towel under the far end of your right / left thigh for comfort.  Relax your leg muscles and allow gravity to straighten your knee. Your clinician may advise you to add an ankle weight if more resistance is helpful for you.  You should feel a stretch in the back of your right / left knee. Hold this position for __________ seconds. Repeat __________ times. Complete this stretch __________ times per day. * Your physician, physical therapist or athletic trainer may ask you to add ankle weight to enhance your stretch.  RANGE OF MOTION - Knee Flexion, Active  Lie on your back with both knees straight. (If this causes back discomfort, bend your opposite knee, placing your foot flat on the floor.)  Slowly slide your heel back toward your buttocks until you feel a gentle stretch in the front of your knee or thigh.  Hold for __________ seconds. Slowly slide your heel back to the starting position. Repeat __________ times. Complete this exercise __________ times per day.  STRETCH - Quadriceps, Prone   Lie on your stomach on a firm surface, such as a bed or padded floor.  Bend your right /  left knee and grasp your ankle. If you are unable to reach, your ankle or pant leg, use a belt around your foot to lengthen your reach.  Gently pull your heel toward your buttocks. Your knee should not slide out to the side. You should feel a stretch in the front of your thigh and/or knee.  Hold this position for __________ seconds. Repeat __________ times. Complete this stretch __________ times per day.  STRETCH  Hamstrings, Supine   Lie on your back. Loop a belt or towel over the ball of your right / left foot.  Straighten your right / left knee and slowly pull on the belt to raise your leg. Do not allow the right / left knee to bend. Keep your opposite leg flat on the floor.  Raise the leg until you feel a gentle stretch behind your right / left knee or thigh. Hold this position for __________ seconds. Repeat __________ times. Complete this stretch __________ times per day.  STRENGTHENING EXERCISES These exercises may help you when beginning to rehabilitate your injury. They may resolve your symptoms with or without further involvement from your physician, physical therapist or athletic trainer. While completing these exercises, remember:   Muscles can gain both the endurance and the strength needed for everyday activities through controlled exercises.  Complete these exercises as instructed by your physician, physical therapist or athletic trainer. Progress the resistance and repetitions only as guided.  You may experience muscle soreness or fatigue, but the pain or discomfort you are trying to eliminate should   never worsen during these exercises. If this pain does worsen, stop and make certain you are following the directions exactly. If the pain is still present after adjustments, discontinue the exercise until you can discuss the trouble with your clinician. STRENGTH - Quadriceps, Isometrics  Lie on your back with your right / left leg extended and your opposite knee bent.  Gradually  tense the muscles in the front of your right / left thigh. You should see either your knee cap slide up toward your hip or increased dimpling just above the knee. This motion will push the back of the knee down toward the floor/mat/bed on which you are lying.  Hold the muscle as tight as you can without increasing your pain for __________ seconds.  Relax the muscles slowly and completely in between each repetition. Repeat __________ times. Complete this exercise __________ times per day.  STRENGTH - Quadriceps, Short Arcs   Lie on your back. Place a __________ inch towel roll under your knee so that the knee slightly bends.  Raise only your lower leg by tightening the muscles in the front of your thigh. Do not allow your thigh to rise.  Hold this position for __________ seconds. Repeat __________ times. Complete this exercise __________ times per day.  OPTIONAL ANKLE WEIGHTS: Begin with ____________________, but DO NOT exceed ____________________. Increase in 1 pound/0.5 kilogram increments.  STRENGTH - Quadriceps, Straight Leg Raises  Quality counts! Watch for signs that the quadriceps muscle is working to insure you are strengthening the correct muscles and not "cheating" by substituting with healthier muscles.  Lay on your back with your right / left leg extended and your opposite knee bent.  Tense the muscles in the front of your right / left thigh. You should see either your knee cap slide up or increased dimpling just above the knee. Your thigh may even quiver.  Tighten these muscles even more and raise your leg 4 to 6 inches off the floor. Hold for __________ seconds.  Keeping these muscles tense, lower your leg.  Relax the muscles slowly and completely in between each repetition. Repeat __________ times. Complete this exercise __________ times per day.  STRENGTH - Hamstring, Curls  Lay on your stomach with your legs extended. (If you lay on a bed, your feet may hang over the  edge.)  Tighten the muscles in the back of your thigh to bend your right / left knee up to 90 degrees. Keep your hips flat on the bed/floor.  Hold this position for __________ seconds.  Slowly lower your leg back to the starting position. Repeat __________ times. Complete this exercise __________ times per day.  OPTIONAL ANKLE WEIGHTS: Begin with ____________________, but DO NOT exceed ____________________. Increase in 1 pound/0.5 kilogram increments.  STRENGTH  Quadriceps, Squats  Stand in a door frame so that your feet and knees are in line with the frame.  Use your hands for balance, not support, on the frame.  Slowly lower your weight, bending at the hips and knees. Keep your lower legs upright so that they are parallel with the door frame. Squat only within the range that does not increase your knee pain. Never let your hips drop below your knees.  Slowly return upright, pushing with your legs, not pulling with your hands. Repeat __________ times. Complete this exercise __________ times per day.  STRENGTH - Quadriceps, Wall Slides  Follow guidelines for form closely. Increased knee pain often results from poorly placed feet or knees.  Lean against   a smooth wall or door and walk your feet out 18-24 inches. Place your feet hip-width apart.  Slowly slide down the wall or door until your knees bend __________ degrees.* Keep your knees over your heels, not your toes, and in line with your hips, not falling to either side.  Hold for __________ seconds. Stand up to rest for __________ seconds in between each repetition. Repeat __________ times. Complete this exercise __________ times per day. * Your physician, physical therapist or athletic trainer will alter this angle based on your symptoms and progress. Document Released: 06/23/2005 Document Revised: 11/01/2011 Document Reviewed: 11/21/2008 Exit[employer] Patient Information 2014 [employer].  

## 2013-09-11 NOTE — Progress Notes (Signed)
   Subjective:    Patient ID: Sean Strong, male    DOB: 04/20/1966, 48 y.o.   MRN: [id number]  HPI In cardiac rehab.  48 y.o. Male presents to clinic with right leg pain . Pain worse at night . Is having trouble with walking and standing. Currently in cardiac rehab and was concerned of a possible blood clot. Hasn't noticed any swelling or warmth to the touch, but is having trouble with activities at rehab. Pain had been going on for the past several weeks. Has previous break to this leg. States that left leg is starting to hurt due to compensating for right leg. Pain 8 out of 10. Was told by therapist to be seen for medical treatment before returning for therapy. Has tried aleve for the pain without much relief.  Is in cardiac rehab for a blocked artery which he had stents for. Had stent surgery in Dec of 2014.  Hx of sprain right knee for a while  Walkin with difficulty Review of Systems     Objective:   Physical Exam  Vitals reviewed. Constitutional: He is oriented to person, place, and time. He appears well-developed and well-nourished. He appears distressed.  HENT:  Head: Normocephalic.  Eyes: EOM are normal.  Cardiovascular: Normal rate.   Pulmonary/Chest: Effort normal.  Musculoskeletal:       Right knee: He exhibits abnormal meniscus. He exhibits normal range of motion, no swelling, no effusion, no ecchymosis, no deformity and no erythema. Tenderness found. Medial joint line tenderness noted.       Left knee: He exhibits normal range of motion, no swelling, no effusion, no ecchymosis, no bony tenderness, normal meniscus and no MCL laxity. Tenderness found.  Neurological: He is alert and oriented to person, place, and time. He has normal strength. No cranial nerve deficit or sensory deficit. He exhibits normal muscle tone. Coordination and gait abnormal.  Psychiatric: He has a normal mood and affect. His behavior is normal.     [facility] reading (PRIMARY) by  Sean Strong djd  bilateral       Assessment & Plan:  Refer to ortho/Sean Strong Hinged knee brace Knee exercises Depomedrol im/Naprosyn 500mg  bid 10d

## 2013-09-11 NOTE — Progress Notes (Signed)
   Subjective:    Patient ID: Sean Strong, male    DOB: 07/13/1966, 48 y.o.   MRN: [id number]  HPI    Review of Systems     Objective:   Physical Exam        Assessment & Plan:

## 2013-09-12 ENCOUNTER — Encounter (HOSPITAL_COMMUNITY): Payer: Federal, State, Local not specified - PPO

## 2013-09-12 ENCOUNTER — Ambulatory Visit (HOSPITAL_COMMUNITY): Payer: Federal, State, Local not specified - PPO

## 2013-09-14 ENCOUNTER — Ambulatory Visit (HOSPITAL_COMMUNITY): Payer: Federal, State, Local not specified - PPO

## 2013-09-14 ENCOUNTER — Encounter (HOSPITAL_COMMUNITY)
Admission: RE | Admit: 2013-09-14 | Discharge: 2013-09-14 | Disposition: A | Discharge status: Home / Self Care | Payer: Federal, State, Local not specified - PPO | Source: Ambulatory Visit | Attending: Cardiology | Admitting: Cardiology

## 2013-09-14 NOTE — Progress Notes (Signed)
I have reviewed home exercise with Sean Strong. The patient was advised to walk 2-4 days per week for 10 minutes, 3 times per day, progressing to 15 minutes, 2 times per day until he can walk 30 minutes continuously.  Pt stated he is not currently completing home exercise, but will begin.  Also encouraged patient to continue his PT exercise for his knee.  Pt will also complete one additional day of hand weights outside of CRP II.  Progression of exercise prescription was discussed.  Reviewed THR, pulse, RPE, sign and symptoms, NTG use and when to call 911 or MD.  Pt voiced understanding.  1315  Sean FreestoneKendall Mahin Guardia, MS, ACSM RCEP 09/14/2013 4:20 PM

## 2013-09-14 NOTE — Progress Notes (Signed)
Melton AlarGilbert L Pyeatt 48 y.o. male Nutrition Note Spoke with pt.  Nutrition Plan and Nutrition Survey goals reviewed with pt. Pt is following Step 2 of the Therapeutic Lifestyle Changes diet. Pt asked for the "do's and don'ts of heart healthy eating." Pt concept re: a heart healthy diet clarified. Pt wants to lose wt. Wt loss tips reviewed. Pt asked about wt loss supplements. Supplements discussed. Pt encouraged to discuss wt loss medication if desired with his PCP. Pt expressed understanding of the information reviewed. Pt aware of nutrition education classes offered and is unable to attend nutrition classes.  Nutrition Diagnosis   Food-and nutrition-related knowledge deficit related to lack of exposure to information as related to diagnosis of: ? CVD   Obesity related to excessive energy intake as evidenced by a BMI of 32.6  Nutrition RX/ Estimated Daily Nutrition Needs for: wt loss  1600-2100 Kcal, 40-55 gm fat, 10-14 gm sat fat, 1.6-2.1 gm trans-fat, <1500 mg sodium   Nutrition Intervention   Pt's individual nutrition plan including cholesterol goals reviewed with pt.   Benefits of adopting Therapeutic Lifestyle Changes discussed when Medficts reviewed.   Pt to attend the Portion Distortion class   Pt given handouts for: ? Nutrition I class ? Nutrition II class   Continue client-centered nutrition education by RD, as part of interdisciplinary care.  Goal(s)   Pt to identify food quantities necessary to achieve: ? wt loss to a goal wt of 198-216 lb (89.9-98.1 kg) at graduation from cardiac rehab.   Monitor and Evaluate progress toward nutrition goal with team. Nutrition Risk:  Low   Mickle PlumbEdna Zackari Ruane, M.Ed, RD, LDN, CDE 09/14/2013 3:17 PM

## 2013-09-14 NOTE — Progress Notes (Signed)
Pt arrived at cardiac rehab reporting he is wearing a knee brace per Urgent Care [facility].  Pt is awaiting orthopaedic appointment.  Pt also states he was given naproxen however unable to tolerate due to GI upset.  Pt instructed to avoid naproxen due to GI upset and let prescribing know of symptoms associated with .  Pt also states he plans to contact Dr. Jacinto HalimGanji office for effient samples.  Understanding verbalized

## 2013-09-17 ENCOUNTER — Ambulatory Visit (HOSPITAL_COMMUNITY): Payer: Federal, State, Local not specified - PPO

## 2013-09-17 ENCOUNTER — Encounter (HOSPITAL_COMMUNITY): Payer: Federal, State, Local not specified - PPO

## 2013-09-19 ENCOUNTER — Encounter (HOSPITAL_COMMUNITY)
Admission: RE | Admit: 2013-09-19 | Discharge: 2013-09-19 | Disposition: A | Discharge status: Home / Self Care | Payer: Federal, State, Local not specified - PPO | Source: Ambulatory Visit | Attending: Cardiology | Admitting: Cardiology

## 2013-09-19 ENCOUNTER — Ambulatory Visit (HOSPITAL_COMMUNITY): Payer: Federal, State, Local not specified - PPO

## 2013-09-21 ENCOUNTER — Ambulatory Visit (HOSPITAL_COMMUNITY): Payer: Federal, State, Local not specified - PPO

## 2013-09-21 ENCOUNTER — Encounter (HOSPITAL_COMMUNITY)
Admission: RE | Admit: 2013-09-21 | Discharge: 2013-09-21 | Disposition: A | Discharge status: Home / Self Care | Payer: Federal, State, Local not specified - PPO | Source: Ambulatory Visit | Attending: Cardiology | Admitting: Cardiology

## 2013-09-24 ENCOUNTER — Ambulatory Visit (HOSPITAL_COMMUNITY): Payer: Federal, State, Local not specified - PPO

## 2013-09-24 ENCOUNTER — Encounter (HOSPITAL_COMMUNITY): Payer: Federal, State, Local not specified - PPO

## 2013-09-26 ENCOUNTER — Encounter (HOSPITAL_COMMUNITY)
Admission: RE | Admit: 2013-09-26 | Discharge: 2013-09-26 | Disposition: A | Discharge status: Home / Self Care | Payer: Federal, State, Local not specified - PPO | Source: Ambulatory Visit | Attending: Cardiology | Admitting: Cardiology

## 2013-09-26 ENCOUNTER — Ambulatory Visit (HOSPITAL_COMMUNITY): Payer: Federal, State, Local not specified - PPO

## 2013-09-26 DIAGNOSIS — F3289 Other specified depressive episodes: Secondary | ICD-10-CM | POA: Insufficient documentation

## 2013-09-26 DIAGNOSIS — Z87891 Personal history of nicotine dependence: Secondary | ICD-10-CM | POA: Insufficient documentation

## 2013-09-26 DIAGNOSIS — E785 Hyperlipidemia, unspecified: Secondary | ICD-10-CM | POA: Insufficient documentation

## 2013-09-26 DIAGNOSIS — Z5189 Encounter for other specified aftercare: Secondary | ICD-10-CM | POA: Insufficient documentation

## 2013-09-26 DIAGNOSIS — Z7982 Long term (current) use of aspirin: Secondary | ICD-10-CM | POA: Insufficient documentation

## 2013-09-26 DIAGNOSIS — I209 Angina pectoris, unspecified: Secondary | ICD-10-CM | POA: Insufficient documentation

## 2013-09-26 DIAGNOSIS — F4310 Post-traumatic stress disorder, unspecified: Secondary | ICD-10-CM | POA: Insufficient documentation

## 2013-09-26 DIAGNOSIS — I2510 Atherosclerotic heart disease of native coronary artery without angina pectoris: Secondary | ICD-10-CM | POA: Insufficient documentation

## 2013-09-26 DIAGNOSIS — F329 Major depressive disorder, single episode, unspecified: Secondary | ICD-10-CM | POA: Insufficient documentation

## 2013-09-28 ENCOUNTER — Encounter (HOSPITAL_COMMUNITY)
Admission: RE | Admit: 2013-09-28 | Discharge: 2013-09-28 | Disposition: A | Discharge status: Home / Self Care | Payer: Federal, State, Local not specified - PPO | Source: Ambulatory Visit | Attending: Cardiology | Admitting: Cardiology

## 2013-09-28 ENCOUNTER — Ambulatory Visit (HOSPITAL_COMMUNITY): Payer: Federal, State, Local not specified - PPO

## 2013-10-01 ENCOUNTER — Ambulatory Visit (HOSPITAL_COMMUNITY): Payer: Federal, State, Local not specified - PPO

## 2013-10-01 ENCOUNTER — Encounter (HOSPITAL_COMMUNITY)
Admission: RE | Admit: 2013-10-01 | Discharge: 2013-10-01 | Disposition: A | Discharge status: Home / Self Care | Payer: Federal, State, Local not specified - PPO | Source: Ambulatory Visit | Attending: Cardiology | Admitting: Cardiology

## 2013-10-03 ENCOUNTER — Encounter (HOSPITAL_COMMUNITY)
Admission: RE | Admit: 2013-10-03 | Discharge: 2013-10-03 | Disposition: A | Discharge status: Home / Self Care | Payer: Federal, State, Local not specified - PPO | Source: Ambulatory Visit | Attending: Cardiology | Admitting: Cardiology

## 2013-10-03 ENCOUNTER — Ambulatory Visit (HOSPITAL_COMMUNITY): Payer: Federal, State, Local not specified - PPO

## 2013-10-05 ENCOUNTER — Encounter (HOSPITAL_COMMUNITY)
Admission: RE | Admit: 2013-10-05 | Discharge: 2013-10-05 | Disposition: A | Discharge status: Home / Self Care | Payer: Federal, State, Local not specified - PPO | Source: Ambulatory Visit | Attending: Cardiology | Admitting: Cardiology

## 2013-10-05 ENCOUNTER — Ambulatory Visit (HOSPITAL_COMMUNITY): Payer: Federal, State, Local not specified - PPO

## 2013-10-08 ENCOUNTER — Ambulatory Visit (HOSPITAL_COMMUNITY): Payer: Federal, State, Local not specified - PPO

## 2013-10-08 ENCOUNTER — Encounter (HOSPITAL_COMMUNITY): Payer: Federal, State, Local not specified - PPO

## 2013-10-10 ENCOUNTER — Encounter (HOSPITAL_COMMUNITY)
Admission: RE | Admit: 2013-10-10 | Discharge: 2013-10-10 | Disposition: A | Discharge status: Home / Self Care | Payer: Federal, State, Local not specified - PPO | Source: Ambulatory Visit | Attending: Cardiology | Admitting: Cardiology

## 2013-10-10 ENCOUNTER — Ambulatory Visit (HOSPITAL_COMMUNITY): Payer: Federal, State, Local not specified - PPO

## 2013-10-12 ENCOUNTER — Ambulatory Visit (HOSPITAL_COMMUNITY): Payer: Federal, State, Local not specified - PPO

## 2013-10-12 ENCOUNTER — Encounter (HOSPITAL_COMMUNITY)
Admission: RE | Admit: 2013-10-12 | Discharge: 2013-10-12 | Disposition: A | Discharge status: Home / Self Care | Payer: Federal, State, Local not specified - PPO | Source: Ambulatory Visit | Attending: Cardiology | Admitting: Cardiology

## 2013-10-15 ENCOUNTER — Encounter (HOSPITAL_COMMUNITY)
Admission: RE | Admit: 2013-10-15 | Discharge: 2013-10-15 | Disposition: A | Discharge status: Home / Self Care | Payer: Federal, State, Local not specified - PPO | Source: Ambulatory Visit | Attending: Cardiology | Admitting: Cardiology

## 2013-10-15 ENCOUNTER — Ambulatory Visit (HOSPITAL_COMMUNITY): Payer: Federal, State, Local not specified - PPO

## 2013-10-17 ENCOUNTER — Ambulatory Visit (HOSPITAL_COMMUNITY): Payer: Federal, State, Local not specified - PPO

## 2013-10-17 ENCOUNTER — Encounter (HOSPITAL_COMMUNITY)
Admission: RE | Admit: 2013-10-17 | Discharge: 2013-10-17 | Disposition: A | Discharge status: Home / Self Care | Payer: Federal, State, Local not specified - PPO | Source: Ambulatory Visit | Attending: Cardiology | Admitting: Cardiology

## 2013-10-19 ENCOUNTER — Encounter (HOSPITAL_COMMUNITY)
Admission: RE | Admit: 2013-10-19 | Discharge: 2013-10-19 | Disposition: A | Discharge status: Home / Self Care | Payer: Federal, State, Local not specified - PPO | Source: Ambulatory Visit | Attending: Cardiology | Admitting: Cardiology

## 2013-10-19 ENCOUNTER — Ambulatory Visit (HOSPITAL_COMMUNITY): Payer: Federal, State, Local not specified - PPO

## 2013-10-22 ENCOUNTER — Encounter (HOSPITAL_COMMUNITY)
Admission: RE | Admit: 2013-10-22 | Discharge: 2013-10-22 | Disposition: A | Discharge status: Home / Self Care | Payer: Federal, State, Local not specified - PPO | Source: Ambulatory Visit | Attending: Cardiology | Admitting: Cardiology

## 2013-10-22 ENCOUNTER — Ambulatory Visit (HOSPITAL_COMMUNITY): Payer: Federal, State, Local not specified - PPO

## 2013-10-22 DIAGNOSIS — F3289 Other specified depressive episodes: Secondary | ICD-10-CM | POA: Insufficient documentation

## 2013-10-22 DIAGNOSIS — I209 Angina pectoris, unspecified: Secondary | ICD-10-CM | POA: Insufficient documentation

## 2013-10-22 DIAGNOSIS — Z87891 Personal history of nicotine dependence: Secondary | ICD-10-CM | POA: Insufficient documentation

## 2013-10-22 DIAGNOSIS — Z5189 Encounter for other specified aftercare: Secondary | ICD-10-CM | POA: Insufficient documentation

## 2013-10-22 DIAGNOSIS — I2510 Atherosclerotic heart disease of native coronary artery without angina pectoris: Secondary | ICD-10-CM | POA: Insufficient documentation

## 2013-10-22 DIAGNOSIS — F4310 Post-traumatic stress disorder, unspecified: Secondary | ICD-10-CM | POA: Insufficient documentation

## 2013-10-22 DIAGNOSIS — F329 Major depressive disorder, single episode, unspecified: Secondary | ICD-10-CM | POA: Insufficient documentation

## 2013-10-22 DIAGNOSIS — E785 Hyperlipidemia, unspecified: Secondary | ICD-10-CM | POA: Insufficient documentation

## 2013-10-22 DIAGNOSIS — Z7982 Long term (current) use of aspirin: Secondary | ICD-10-CM | POA: Insufficient documentation

## 2013-10-24 ENCOUNTER — Encounter (HOSPITAL_COMMUNITY)
Admission: RE | Admit: 2013-10-24 | Discharge: 2013-10-24 | Disposition: A | Discharge status: Home / Self Care | Payer: Federal, State, Local not specified - PPO | Source: Ambulatory Visit | Attending: Cardiology | Admitting: Cardiology

## 2013-10-24 ENCOUNTER — Ambulatory Visit (HOSPITAL_COMMUNITY): Payer: Federal, State, Local not specified - PPO

## 2013-10-26 ENCOUNTER — Encounter (HOSPITAL_COMMUNITY): Payer: Federal, State, Local not specified - PPO

## 2013-10-26 ENCOUNTER — Ambulatory Visit (HOSPITAL_COMMUNITY): Payer: Federal, State, Local not specified - PPO

## 2013-10-29 ENCOUNTER — Ambulatory Visit (HOSPITAL_COMMUNITY): Payer: Federal, State, Local not specified - PPO

## 2013-10-29 ENCOUNTER — Encounter (HOSPITAL_COMMUNITY)
Admission: RE | Admit: 2013-10-29 | Discharge: 2013-10-29 | Disposition: A | Discharge status: Home / Self Care | Payer: Federal, State, Local not specified - PPO | Source: Ambulatory Visit | Attending: Cardiology | Admitting: Cardiology

## 2013-10-31 ENCOUNTER — Encounter (HOSPITAL_COMMUNITY)
Admission: RE | Admit: 2013-10-31 | Discharge: 2013-10-31 | Disposition: A | Discharge status: Home / Self Care | Payer: Federal, State, Local not specified - PPO | Source: Ambulatory Visit | Attending: Cardiology | Admitting: Cardiology

## 2013-10-31 ENCOUNTER — Ambulatory Visit (HOSPITAL_COMMUNITY): Payer: Federal, State, Local not specified - PPO

## 2013-11-02 ENCOUNTER — Encounter (HOSPITAL_COMMUNITY)
Admission: RE | Admit: 2013-11-02 | Discharge: 2013-11-02 | Disposition: A | Discharge status: Home / Self Care | Payer: Federal, State, Local not specified - PPO | Source: Ambulatory Visit | Attending: Cardiology | Admitting: Cardiology

## 2013-11-02 ENCOUNTER — Ambulatory Visit (HOSPITAL_COMMUNITY): Payer: Federal, State, Local not specified - PPO

## 2013-11-05 ENCOUNTER — Ambulatory Visit (HOSPITAL_COMMUNITY): Payer: Federal, State, Local not specified - PPO

## 2013-11-05 ENCOUNTER — Encounter (HOSPITAL_COMMUNITY)
Admission: RE | Admit: 2013-11-05 | Discharge: 2013-11-05 | Disposition: A | Discharge status: Home / Self Care | Payer: Federal, State, Local not specified - PPO | Source: Ambulatory Visit | Attending: Cardiology | Admitting: Cardiology

## 2013-11-07 ENCOUNTER — Ambulatory Visit (HOSPITAL_COMMUNITY): Payer: Federal, State, Local not specified - PPO

## 2013-11-07 ENCOUNTER — Encounter (HOSPITAL_COMMUNITY)
Admission: RE | Admit: 2013-11-07 | Discharge: 2013-11-07 | Disposition: A | Discharge status: Home / Self Care | Payer: Federal, State, Local not specified - PPO | Source: Ambulatory Visit | Attending: Cardiology | Admitting: Cardiology

## 2013-11-09 ENCOUNTER — Encounter (HOSPITAL_COMMUNITY)
Admission: RE | Admit: 2013-11-09 | Discharge: 2013-11-09 | Disposition: A | Discharge status: Home / Self Care | Payer: Federal, State, Local not specified - PPO | Source: Ambulatory Visit | Attending: Cardiology | Admitting: Cardiology

## 2013-11-09 ENCOUNTER — Ambulatory Visit (HOSPITAL_COMMUNITY): Payer: Federal, State, Local not specified - PPO

## 2013-11-12 ENCOUNTER — Ambulatory Visit (HOSPITAL_COMMUNITY): Payer: Federal, State, Local not specified - PPO

## 2013-11-12 ENCOUNTER — Encounter (HOSPITAL_COMMUNITY)
Admission: RE | Admit: 2013-11-12 | Discharge: 2013-11-12 | Disposition: A | Discharge status: Home / Self Care | Payer: Federal, State, Local not specified - PPO | Source: Ambulatory Visit | Attending: Cardiology | Admitting: Cardiology

## 2013-11-14 ENCOUNTER — Ambulatory Visit (HOSPITAL_COMMUNITY): Payer: Federal, State, Local not specified - PPO

## 2013-11-14 ENCOUNTER — Encounter (HOSPITAL_COMMUNITY): Payer: Federal, State, Local not specified - PPO

## 2013-11-16 ENCOUNTER — Encounter (HOSPITAL_COMMUNITY): Payer: Federal, State, Local not specified - PPO

## 2013-11-16 ENCOUNTER — Ambulatory Visit (HOSPITAL_COMMUNITY): Payer: Federal, State, Local not specified - PPO

## 2013-11-19 ENCOUNTER — Ambulatory Visit (HOSPITAL_COMMUNITY): Payer: Federal, State, Local not specified - PPO

## 2013-11-19 ENCOUNTER — Encounter (HOSPITAL_COMMUNITY): Payer: Federal, State, Local not specified - PPO

## 2013-11-21 ENCOUNTER — Encounter (HOSPITAL_COMMUNITY): Payer: Federal, State, Local not specified - PPO

## 2013-11-21 ENCOUNTER — Ambulatory Visit (INDEPENDENT_AMBULATORY_CARE_PROVIDER_SITE_OTHER): Payer: Federal, State, Local not specified - PPO | Admitting: Family Medicine

## 2013-11-21 ENCOUNTER — Ambulatory Visit (HOSPITAL_COMMUNITY): Payer: Federal, State, Local not specified - PPO

## 2013-11-21 ENCOUNTER — Ambulatory Visit: Payer: Federal, State, Local not specified - PPO

## 2013-11-21 VITALS — BP 120/70 | HR 77 | Temp 98.0°F | Resp 17 | Ht 70.0 in | Wt 224.0 lb

## 2013-11-21 DIAGNOSIS — R05 Cough: Secondary | ICD-10-CM

## 2013-11-21 DIAGNOSIS — R059 Cough, unspecified: Secondary | ICD-10-CM

## 2013-11-21 DIAGNOSIS — M25569 Pain in unspecified knee: Secondary | ICD-10-CM

## 2013-11-21 DIAGNOSIS — R040 Epistaxis: Secondary | ICD-10-CM

## 2013-11-21 DIAGNOSIS — J029 Acute pharyngitis, unspecified: Secondary | ICD-10-CM

## 2013-11-21 DIAGNOSIS — M25562 Pain in left knee: Secondary | ICD-10-CM

## 2013-11-21 DIAGNOSIS — I25810 Atherosclerosis of coronary artery bypass graft(s) without angina pectoris: Secondary | ICD-10-CM

## 2013-11-21 MED ORDER — METHYLPREDNISOLONE ACETATE 80 MG/ML IJ SUSP: 80.0000 mg | Freq: Once | INTRAMUSCULAR | Status: DC

## 2013-11-21 MED ORDER — AZITHROMYCIN 250 MG PO TABS: ORAL_TABLET | ORAL | Status: DC

## 2013-11-21 MED ORDER — HYDROCOD POLST-CHLORPHEN POLST 10-8 MG/5ML PO LQCR: 5.0000 mL | Freq: Two times a day (BID) | ORAL | Status: DC | PRN

## 2013-11-21 MED ORDER — METHYLPREDNISOLONE ACETATE 80 MG/ML IJ SUSP
80.0000 mg | Freq: Once | INTRAMUSCULAR | Status: AC
  Administered 2013-11-21: 80 mg via INTRA_ARTICULAR

## 2013-11-21 NOTE — Patient Instructions (Signed)
Nosebleed Nosebleeds can be caused by many conditions including trauma, infections, polyps, foreign bodies, dry mucous membranes or climate, medications and air conditioning. Most nosebleeds occur in the front of the nose. It is because of this location that most nosebleeds can be controlled by pinching the nostrils gently and continuously. Do this for at least 10 to 20 minutes. The reason for this long continuous pressure is that you must hold it long enough for the blood to clot. If during that 10 to 20 minute time period, pressure is released, the process may have to be started again. The nosebleed may stop by itself, quit with pressure, need concentrated heating (cautery) or stop with pressure from packing. HOME CARE INSTRUCTIONS   If your nose was packed, try to maintain the pack inside until your caregiver removes it. If a gauze pack was used and it starts to fall out, gently replace or cut the end off. Do not cut if a balloon catheter was used to pack the nose. Otherwise, do not remove unless instructed.  Avoid blowing your nose for 12 hours after treatment. This could dislodge the pack or clot and start bleeding again.  If the bleeding starts again, sit up and bending forward, gently pinch the front half of your nose continuously for 20 minutes.  If bleeding was caused by dry mucous membranes, cover the inside of your nose every morning with a petroleum or antibiotic ointment. Use your little fingertip as an applicator. Do this as needed during dry weather. This will keep the mucous membranes moist and allow them to heal.  Maintain humidity in your home by using less air conditioning or using a humidifier.  Do not use aspirin or medications which make bleeding more likely. Your caregiver can give you recommendations on this.  Resume normal activities as able but try to avoid straining, lifting or bending at the waist for several days.  If the nosebleeds become recurrent and the cause is  unknown, your caregiver may suggest laboratory tests. SEEK IMMEDIATE MEDICAL CARE IF:   Bleeding recurs and cannot be controlled.  There is unusual bleeding from or bruising on other parts of the body.  You have a fever.  Nosebleeds continue.  There is any worsening of the condition which originally brought you in.  You become lightheaded, feel faint, become sweaty or vomit blood. MAKE SURE YOU:   Understand these instructions.  Will watch your condition.  Will get help right away if you are not doing well or get worse. Document Released: 05/19/2005 Document Revised: 11/01/2011 Document Reviewed: 07/11/2009 [facility] Patient Information 2014 [place]. Bronchitis Bronchitis is inflammation of the airways that extend from the windpipe into the lungs (bronchi). The inflammation often causes mucus to develop, which leads to a cough. If the inflammation becomes severe, it may cause shortness of breath. CAUSES  Bronchitis may be caused by:   Viral infections.   Bacteria.   Cigarette smoke.   Allergens, pollutants, and other irritants.  SIGNS AND SYMPTOMS  The most common symptom of bronchitis is a frequent cough that produces mucus. Other symptoms include:  Fever.   Body aches.   Chest congestion.   Chills.   Shortness of breath.   Sore throat.  DIAGNOSIS  Bronchitis is usually diagnosed through a medical history and physical exam. Tests, such as chest X-rays, are sometimes done to rule out other conditions.  TREATMENT  You may need to avoid contact with whatever caused the problem (smoking, for example). Medicines are sometimes  needed. These may include:  Antibiotics. These may be prescribed if the condition is caused by bacteria.  Cough suppressants. These may be prescribed for relief of cough symptoms.   Inhaled medicines. These may be prescribed to help open your airways and make it easier for you to breathe.   Steroid medicines. These may  be prescribed for those with recurrent (chronic) bronchitis. HOME CARE INSTRUCTIONS  Get plenty of rest.   Drink enough fluids to keep your urine clear or pale yellow (unless you have a medical condition that requires fluid restriction). Increasing fluids may help thin your secretions and will prevent dehydration.   Only take over-the-counter or prescription medicines as directed by your health care provider.  Only take antibiotics as directed. Make sure you finish them even if you start to feel better.  Avoid secondhand smoke, irritating chemicals, and strong fumes. These will make bronchitis worse. If you are a smoker, quit smoking. Consider using nicotine gum or skin patches to help control withdrawal symptoms. Quitting smoking will help your lungs heal faster.   Put a cool-mist humidifier in your bedroom at night to moisten the air. This may help loosen mucus. Change the water in the humidifier daily. You can also run the hot water in your shower and sit in the bathroom with the door closed for 5 10 minutes.   Follow up with your health care provider as directed.   Wash your hands frequently to avoid catching bronchitis again or spreading an infection to others.  SEEK MEDICAL CARE IF: Your symptoms do not improve after 1 week of treatment.  SEEK IMMEDIATE MEDICAL CARE IF:  Your fever increases.  You have chills.   You have chest pain.   You have worsening shortness of breath.   You have bloody sputum.  You faint.  You have lightheadedness.  You have a severe headache.   You vomit repeatedly. MAKE SURE YOU:   Understand these instructions.  Will watch your condition.  Will get help right away if you are not doing well or get worse. Document Released: 08/09/2005 Document Revised: 05/30/2013 Document Reviewed: 04/03/2013 [facility] Patient Information 2014 [place], [place].

## 2013-11-21 NOTE — Progress Notes (Signed)
Subjective:    Patient ID: Sean Strong, male    DOB: 01-24-1966, 48 y.o.   MRN: [id number]  This chart was scribed for Sean Sidle, MD by Blanchard Kelch, ED Scribe.   Chief Complaint  Patient presents with   Knee Pain   Nasal Congestion   Chest Pain    PCP: No primary provider on file.   HPI  Sean Strong is a 48 y.o. male who presents to office complaining of a constant, unchanged URI for six days. He reports associated symptoms of a productive cough with yellow sputum, sore throat, chest pain with coughing and headaches. He denies fever or chills. He states that he has had severe nosebleeds that last up to six days due to the dry weather and the medications he was placed on after his stent placement in December 2014. He reports having two stents placed and denies any complications since the surgery.   He would also like a cortisone injection and a brace for his left knee. He was having similar pain in his right knee and reports significant improvement on the pain with that treatment plan.   He is a retired Cytogeneticist. He is staying in shape through his cardiology physical therapy. He was a former smoker but reports quitting about two years ago.   Review of Systems ROS Consitutional: No fever, chills, fatigue, night sweats, lymphadenopathy, or weight changes. Eyes: No visual changes, eye redness, or discharge. ENT/Mouth: Ears: No otalgia, tinnitus, hearing loss, discharge. Nose: Positive for congestion, rhinorrhea, epistaxis. Throat: Positive for sore throat, Cardiovascular: Positive for post-tussive chest pain No palpitations, diaphoresis, DOE, edema, orthopnea, PND. Respiratory: No cough, hemoptysis, SOB, or wheezing. Gastrointestinal: No anorexia, dysphagia, reflux, pain, nausea, vomiting, hematemesis, diarrhea, constipation, BRBPR, or melena. Genitourinary: No dysuria, frequency, urgency, hematuria, incontinence, nocturia, decreased urinary stream, discharge,  impotence, or testicular pain/masses. Musculoskeletal: No decreased ROM, myalgias, stiffness, joint swelling, or weakness. Skin: No rash, erythema, lesion changes, pain, warmth, jaundice, or pruritis. Neurological: Positive for headache, No dizziness, syncope, seizures, tremors, memory loss, coordination problems, or paresthesias. Psychological: No anxiety, depression, hallucinations, SI/HI. Endocrine: No fatigue, polydipsia, polyphagia or polyuria. All other systems were reviewed and are otherwise negative.        Objective:   Physical Exam General: Well-developed, well-nourished male in no acute distress; appearance consistent with age of record HENT: normocephalic; atraumatic Epistaxis from both sides of nasal septum Reddened posterior pharynx Eyes: pupils equal, round and reactive to light; extraocular muscles intact Neck: supple Heart: regular rate and rhythm; no murmurs, rubs or gallops Lungs: clear to auscultation bilaterally Abdomen: soft; nondistended; nontender; no masses or hepatosplenomegaly; bowel sounds present Extremities: No deformity; full range of motion; pulses normal Neurologic: Awake, alert and oriented; motor function intact in all extremities and symmetric; no facial droop Skin: Warm and dry Psychiatric: Normal mood and affect  [facility] reading (PRIMARY) by Dr. Milus Glazier: CXR:  Hazy streaky density RLL  Right nasal septum was cauterized with silver nitrate  Left knee was injected after Betadine prep)( with 1 cc of Depo-Medrol and 1 cc of Marcaine with good relief. There's no complications.    Assessment & Plan:   Cough - Plan: DG Chest 2 View, azithromycin (ZITHROMAX Z-PAK) 250 MG tablet, chlorpheniramine-HYDROcodone (TUSSIONEX PENNKINETIC ER) 10-8 MG/5ML LQCR  CAD (coronary artery disease) of artery bypass graft  Epistaxis  Knee pain, left - Plan: methylPREDNISolone acetate (DEPO-MEDROL) injection 80 mg  Sore throat - Plan: azithromycin (ZITHROMAX  Z-PAK) 250 MG tablet  Signed, Sean SidleKurt Lauenstein, MD    I personally performed the services described in this documentation, which was scribed in my presence. The recorded information has been reviewed and is accurate.

## 2013-11-23 ENCOUNTER — Encounter (HOSPITAL_COMMUNITY)
Admission: RE | Admit: 2013-11-23 | Discharge: 2013-11-23 | Disposition: A | Discharge status: Home / Self Care | Payer: Federal, State, Local not specified - PPO | Source: Ambulatory Visit | Attending: Cardiology | Admitting: Cardiology

## 2013-11-23 ENCOUNTER — Ambulatory Visit (HOSPITAL_COMMUNITY): Payer: Federal, State, Local not specified - PPO

## 2013-11-23 DIAGNOSIS — F3289 Other specified depressive episodes: Secondary | ICD-10-CM | POA: Insufficient documentation

## 2013-11-23 DIAGNOSIS — Z5189 Encounter for other specified aftercare: Secondary | ICD-10-CM | POA: Insufficient documentation

## 2013-11-23 DIAGNOSIS — Z7982 Long term (current) use of aspirin: Secondary | ICD-10-CM | POA: Insufficient documentation

## 2013-11-23 DIAGNOSIS — I2510 Atherosclerotic heart disease of native coronary artery without angina pectoris: Secondary | ICD-10-CM | POA: Insufficient documentation

## 2013-11-23 DIAGNOSIS — I209 Angina pectoris, unspecified: Secondary | ICD-10-CM | POA: Insufficient documentation

## 2013-11-23 DIAGNOSIS — F329 Major depressive disorder, single episode, unspecified: Secondary | ICD-10-CM | POA: Insufficient documentation

## 2013-11-23 DIAGNOSIS — F4310 Post-traumatic stress disorder, unspecified: Secondary | ICD-10-CM | POA: Insufficient documentation

## 2013-11-23 DIAGNOSIS — Z87891 Personal history of nicotine dependence: Secondary | ICD-10-CM | POA: Insufficient documentation

## 2013-11-23 DIAGNOSIS — E785 Hyperlipidemia, unspecified: Secondary | ICD-10-CM | POA: Insufficient documentation

## 2013-11-26 ENCOUNTER — Encounter (HOSPITAL_COMMUNITY): Payer: Federal, State, Local not specified - PPO

## 2013-11-26 ENCOUNTER — Ambulatory Visit (HOSPITAL_COMMUNITY): Payer: Federal, State, Local not specified - PPO

## 2013-11-28 ENCOUNTER — Encounter (HOSPITAL_COMMUNITY)
Admission: RE | Admit: 2013-11-28 | Discharge: 2013-11-28 | Disposition: A | Discharge status: Home / Self Care | Payer: Federal, State, Local not specified - PPO | Source: Ambulatory Visit | Attending: Cardiology | Admitting: Cardiology

## 2013-11-28 ENCOUNTER — Ambulatory Visit (HOSPITAL_COMMUNITY): Payer: Federal, State, Local not specified - PPO

## 2013-11-30 ENCOUNTER — Ambulatory Visit (HOSPITAL_COMMUNITY): Payer: Federal, State, Local not specified - PPO

## 2013-11-30 ENCOUNTER — Encounter (HOSPITAL_COMMUNITY)
Admission: RE | Admit: 2013-11-30 | Discharge: 2013-11-30 | Disposition: A | Discharge status: Home / Self Care | Payer: Federal, State, Local not specified - PPO | Source: Ambulatory Visit | Attending: Cardiology | Admitting: Cardiology

## 2013-12-03 ENCOUNTER — Encounter (HOSPITAL_COMMUNITY): Payer: Federal, State, Local not specified - PPO

## 2013-12-05 ENCOUNTER — Encounter (HOSPITAL_COMMUNITY)
Admission: RE | Admit: 2013-12-05 | Discharge: 2013-12-05 | Disposition: A | Discharge status: Home / Self Care | Payer: Federal, State, Local not specified - PPO | Source: Ambulatory Visit | Attending: Cardiology | Admitting: Cardiology

## 2013-12-05 NOTE — Progress Notes (Signed)
Nutrition Note Spoke with pt. One of pt's goals during rehab was to improve his eating habits. Pt reports he is eating chicken and Mal[place]urkey, egg whites, very little cheese, low-fat ice cream, whole grain bread, heart healthy fats, and more fruits and vegetables. Pt is watching his sodium intake by choosing frozen vegetables and "not adding any salt." Per discussion, pt continues to eat poultry skin. Pt willing to decrease and work toward removing poultry skin from his diet. Pt is choosing "almost no red meat" and occasionally allows an indulgence in "a biscuit once a month." Feel pt has moved toward making heart healthier dietary choices and will continue to work toward a heart healthy diet after Cardiac Rehab graduation. Continue client-centered nutrition education by RD as part of interdisciplinary care.  Monitor and evaluate progress toward nutrition goal with team.  Mickle PlumbEdna Lillymae Duet, M.Ed, RD, LDN, CDE 12/05/2013 2:25 PM

## 2013-12-07 ENCOUNTER — Encounter (HOSPITAL_COMMUNITY)
Admission: RE | Admit: 2013-12-07 | Discharge: 2013-12-07 | Disposition: A | Discharge status: Home / Self Care | Payer: Federal, State, Local not specified - PPO | Source: Ambulatory Visit | Attending: Cardiology | Admitting: Cardiology

## 2013-12-10 ENCOUNTER — Encounter (HOSPITAL_COMMUNITY)
Admission: RE | Admit: 2013-12-10 | Discharge: 2013-12-10 | Disposition: A | Discharge status: Home / Self Care | Payer: Federal, State, Local not specified - PPO | Source: Ambulatory Visit | Attending: Cardiology | Admitting: Cardiology

## 2013-12-12 ENCOUNTER — Encounter (HOSPITAL_COMMUNITY)
Admission: RE | Admit: 2013-12-12 | Discharge: 2013-12-12 | Disposition: A | Discharge status: Home / Self Care | Payer: Federal, State, Local not specified - PPO | Source: Ambulatory Visit | Attending: Cardiology | Admitting: Cardiology

## 2013-12-14 ENCOUNTER — Encounter (HOSPITAL_COMMUNITY): Payer: Self-pay

## 2013-12-14 ENCOUNTER — Encounter (HOSPITAL_COMMUNITY)
Admission: RE | Admit: 2013-12-14 | Discharge: 2013-12-14 | Disposition: A | Discharge status: Home / Self Care | Payer: Federal, State, Local not specified - PPO | Source: Ambulatory Visit | Attending: Cardiology | Admitting: Cardiology

## 2013-12-14 NOTE — Progress Notes (Signed)
Pt graduated from cardiac rehab program today.  Medication list reconciled.  PHQ9 score- 10 .  Pt admits he is not getting adequate relief from his current regimen. [facility]  Pt states he has upcoming appointment scheduled and will call sooner if necessary.  Pt has made significant lifestyle changes and should be commended for his success. Pt plans to continue exercise at [employer].

## 2013-12-17 ENCOUNTER — Encounter (HOSPITAL_COMMUNITY): Payer: Federal, State, Local not specified - PPO

## 2013-12-19 ENCOUNTER — Encounter (HOSPITAL_COMMUNITY): Payer: Federal, State, Local not specified - PPO

## 2013-12-21 ENCOUNTER — Encounter (HOSPITAL_COMMUNITY): Payer: Federal, State, Local not specified - PPO

## 2013-12-24 ENCOUNTER — Encounter (HOSPITAL_COMMUNITY): Payer: Federal, State, Local not specified - PPO

## 2013-12-26 ENCOUNTER — Encounter (HOSPITAL_COMMUNITY): Payer: Federal, State, Local not specified - PPO

## 2013-12-28 ENCOUNTER — Encounter (HOSPITAL_COMMUNITY): Payer: Federal, State, Local not specified - PPO

## 2014-08-01 ENCOUNTER — Encounter (HOSPITAL_COMMUNITY): Payer: Self-pay | Admitting: Cardiology

## 2015-04-02 ENCOUNTER — Ambulatory Visit (INDEPENDENT_AMBULATORY_CARE_PROVIDER_SITE_OTHER): Payer: Federal, State, Local not specified - PPO | Admitting: Podiatry

## 2015-04-02 ENCOUNTER — Encounter: Payer: Self-pay | Admitting: Podiatry

## 2015-04-02 VITALS — BP 125/77 | HR 62 | Resp 16 | Ht 68.0 in | Wt 215.0 lb

## 2015-04-02 DIAGNOSIS — L600 Ingrowing nail: Secondary | ICD-10-CM

## 2015-04-02 NOTE — Progress Notes (Signed)
   Subjective:    Patient ID: Sean Strong, male    DOB: 11/09/1965, 49 y.o.   MRN: [id number]  HPI Patient presents with an ingrown toenail, left foot, great toe. Has had for past 5 months.   Review of Systems  All other systems reviewed and are negative.      Objective:   Physical Exam        Assessment & Plan:

## 2015-04-02 NOTE — Patient Instructions (Signed)

## 2015-04-02 NOTE — Progress Notes (Signed)
Subjective:     Patient ID: Sean Strong, male   DOB: 1965/09/06, 49 y.o.   MRN: [id number]  HPI patient presents with painful ingrown toenail left big toe lateral border that's been present for around 5 months and states that he has tried to trim it out and soak it without relief of symptoms   Review of Systems  All other systems reviewed and are negative.      Objective:   Physical Exam  Constitutional: He is oriented to person, place, and time.  Cardiovascular: Intact distal pulses.   Musculoskeletal: Normal range of motion.  Neurological: He is oriented to person, place, and time.  Skin: Skin is warm.  Nursing note and vitals reviewed.  neurovascular status intact muscle strength adequate range of motion within normal limits with patient found to have incurvated left hallux lateral border that's very tender when pressed making shoe gear difficult and patient states that it has been red at the tip. Patient has good digital perfusion is well oriented 3 and has moderate equinus condition     Assessment:     Ingrown toenail deformity left hallux lateral border with damage to the entire nail bed and plate    Plan:     H&P and condition reviewed with patient. I've recommended removal of the corner and I explained the surgery and what would be required and patient wants to have this fixed. Today I went ahead and I discussed risk and then went ahead and infiltrated 60 Milligan Xylocaine Marcaine mixture remove the lateral border exposed matrix and applied phenol 3 applications 30 seconds followed by alcohol lavage and sterile dressing. Gave instructions on soaks and reappoint

## 2015-04-03 ENCOUNTER — Telehealth: Payer: Self-pay | Admitting: *Deleted

## 2015-04-03 NOTE — Telephone Encounter (Signed)
Left message at [phone] (home #) for patient to call me back regarding their ingrown toenail procedure they had done yesterday on 04/02/15. Waiting for a response back.

## 2015-04-11 ENCOUNTER — Telehealth: Payer: Self-pay | Admitting: *Deleted

## 2015-04-11 MED ORDER — CEPHALEXIN 500 MG PO CAPS: 500.0000 mg | ORAL_CAPSULE | Freq: Three times a day (TID) | ORAL | Status: DC

## 2015-04-11 NOTE — Telephone Encounter (Signed)
Pt spoke with scheduler and was scheduled for 04/14/2015 with Dr. Charlsie Merles, due to draining toe after an ingrown toenail procedure.  Pt states doing Dial antibacterial soap in hot water daily; I instructed pt to go to a more drying soak with the Epsom salt 1/4-1/2C in Qt warm water twice daily, cover the toe with antibiotic ointment bandaid when up during the day or in shoes, but could allow to air dry in the evening, do this for 4-6 weeks until the area got a dry hard scab without drainage. Dr. Charlsie Merles states take Keflex  #21 1 capsule tid and if better on Monday can cancel appt.  Pt states understanding and I ordered the Keflex at [facility].

## 2015-04-14 ENCOUNTER — Ambulatory Visit: Payer: Federal, State, Local not specified - PPO | Admitting: Podiatry

## 2015-12-15 DIAGNOSIS — H524 Presbyopia: Secondary | ICD-10-CM | POA: Diagnosis not present

## 2015-12-15 DIAGNOSIS — H52203 Unspecified astigmatism, bilateral: Secondary | ICD-10-CM | POA: Diagnosis not present

## 2015-12-15 DIAGNOSIS — H40003 Preglaucoma, unspecified, bilateral: Secondary | ICD-10-CM | POA: Diagnosis not present

## 2015-12-15 DIAGNOSIS — H5203 Hypermetropia, bilateral: Secondary | ICD-10-CM | POA: Diagnosis not present

## 2015-12-23 DIAGNOSIS — M65341 Trigger finger, right ring finger: Secondary | ICD-10-CM | POA: Diagnosis not present

## 2015-12-23 DIAGNOSIS — M65332 Trigger finger, left middle finger: Secondary | ICD-10-CM | POA: Diagnosis not present

## 2015-12-23 DIAGNOSIS — M65342 Trigger finger, left ring finger: Secondary | ICD-10-CM | POA: Diagnosis not present

## 2015-12-23 DIAGNOSIS — M65331 Trigger finger, right middle finger: Secondary | ICD-10-CM | POA: Diagnosis not present

## 2015-12-24 IMAGING — CR DG CHEST 2V
2 series · 2 of 2 positions shown · non-contrast
Comparison: None.

CLINICAL DATA: Cough.

EXAM:
CHEST  2 VIEW

[PA]
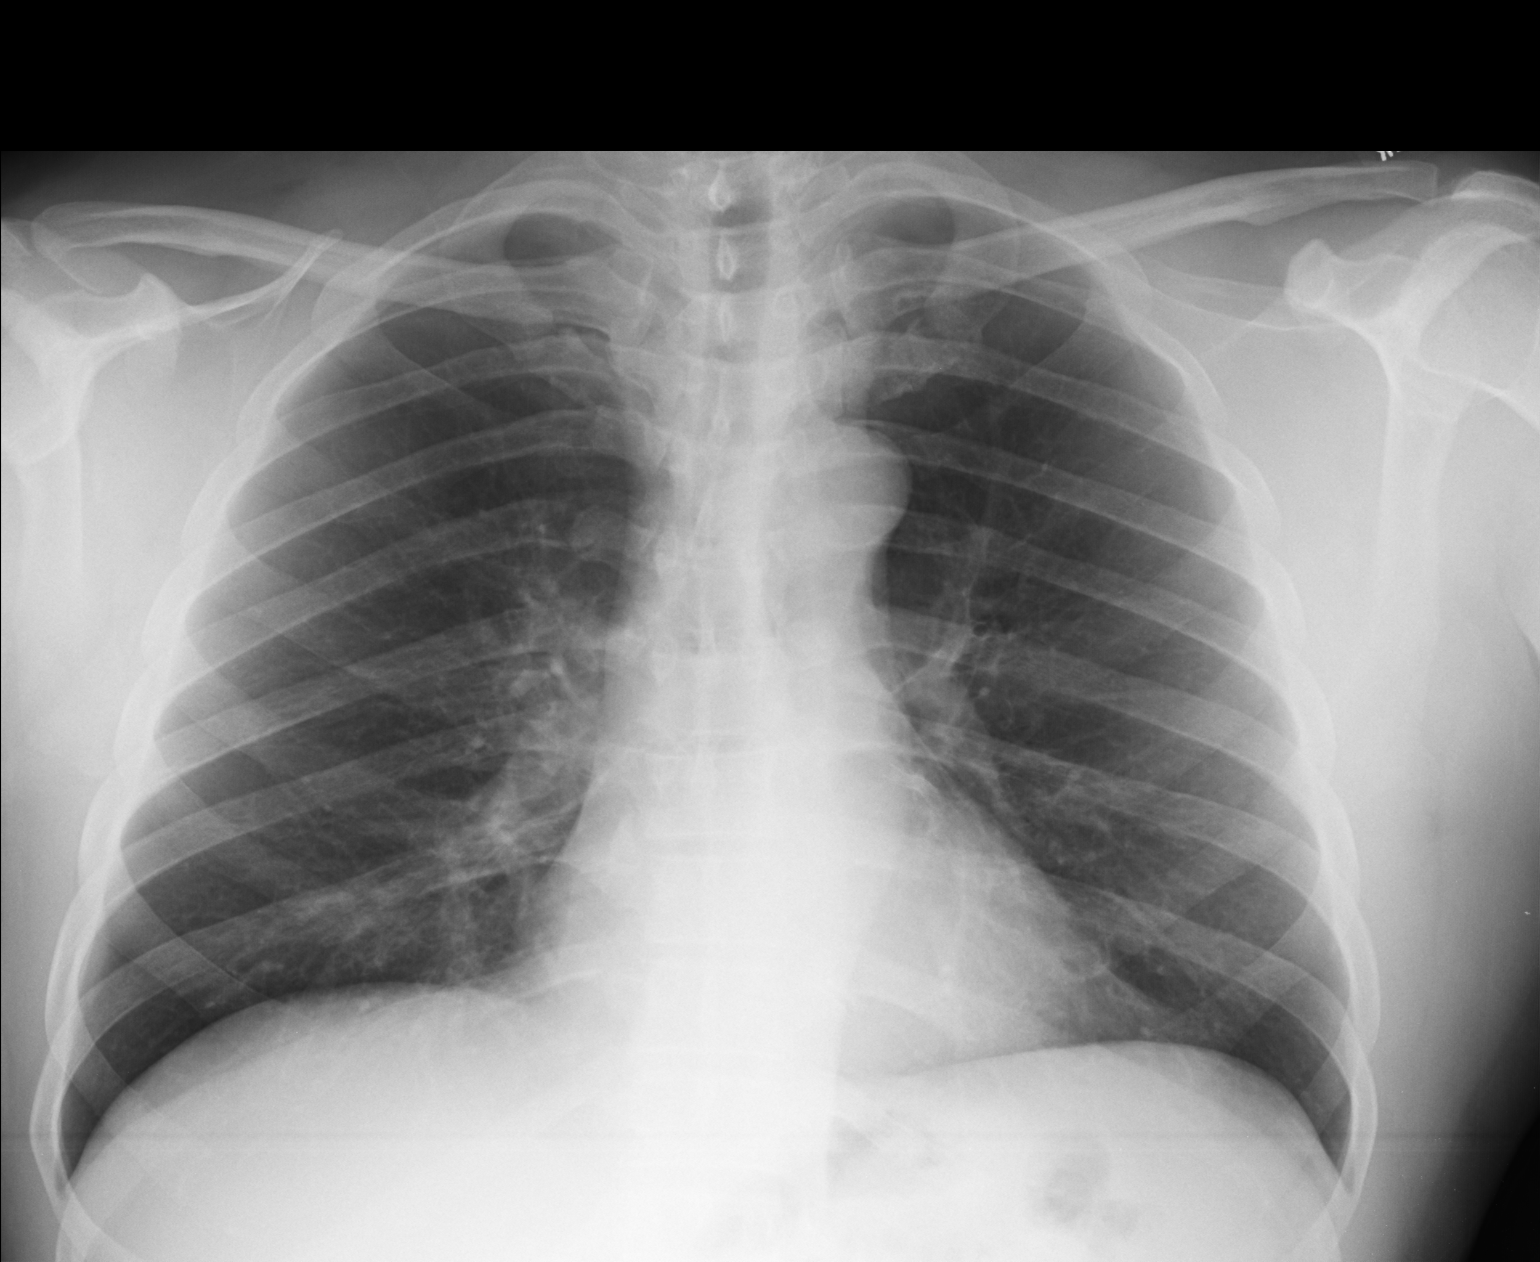

[lateral]
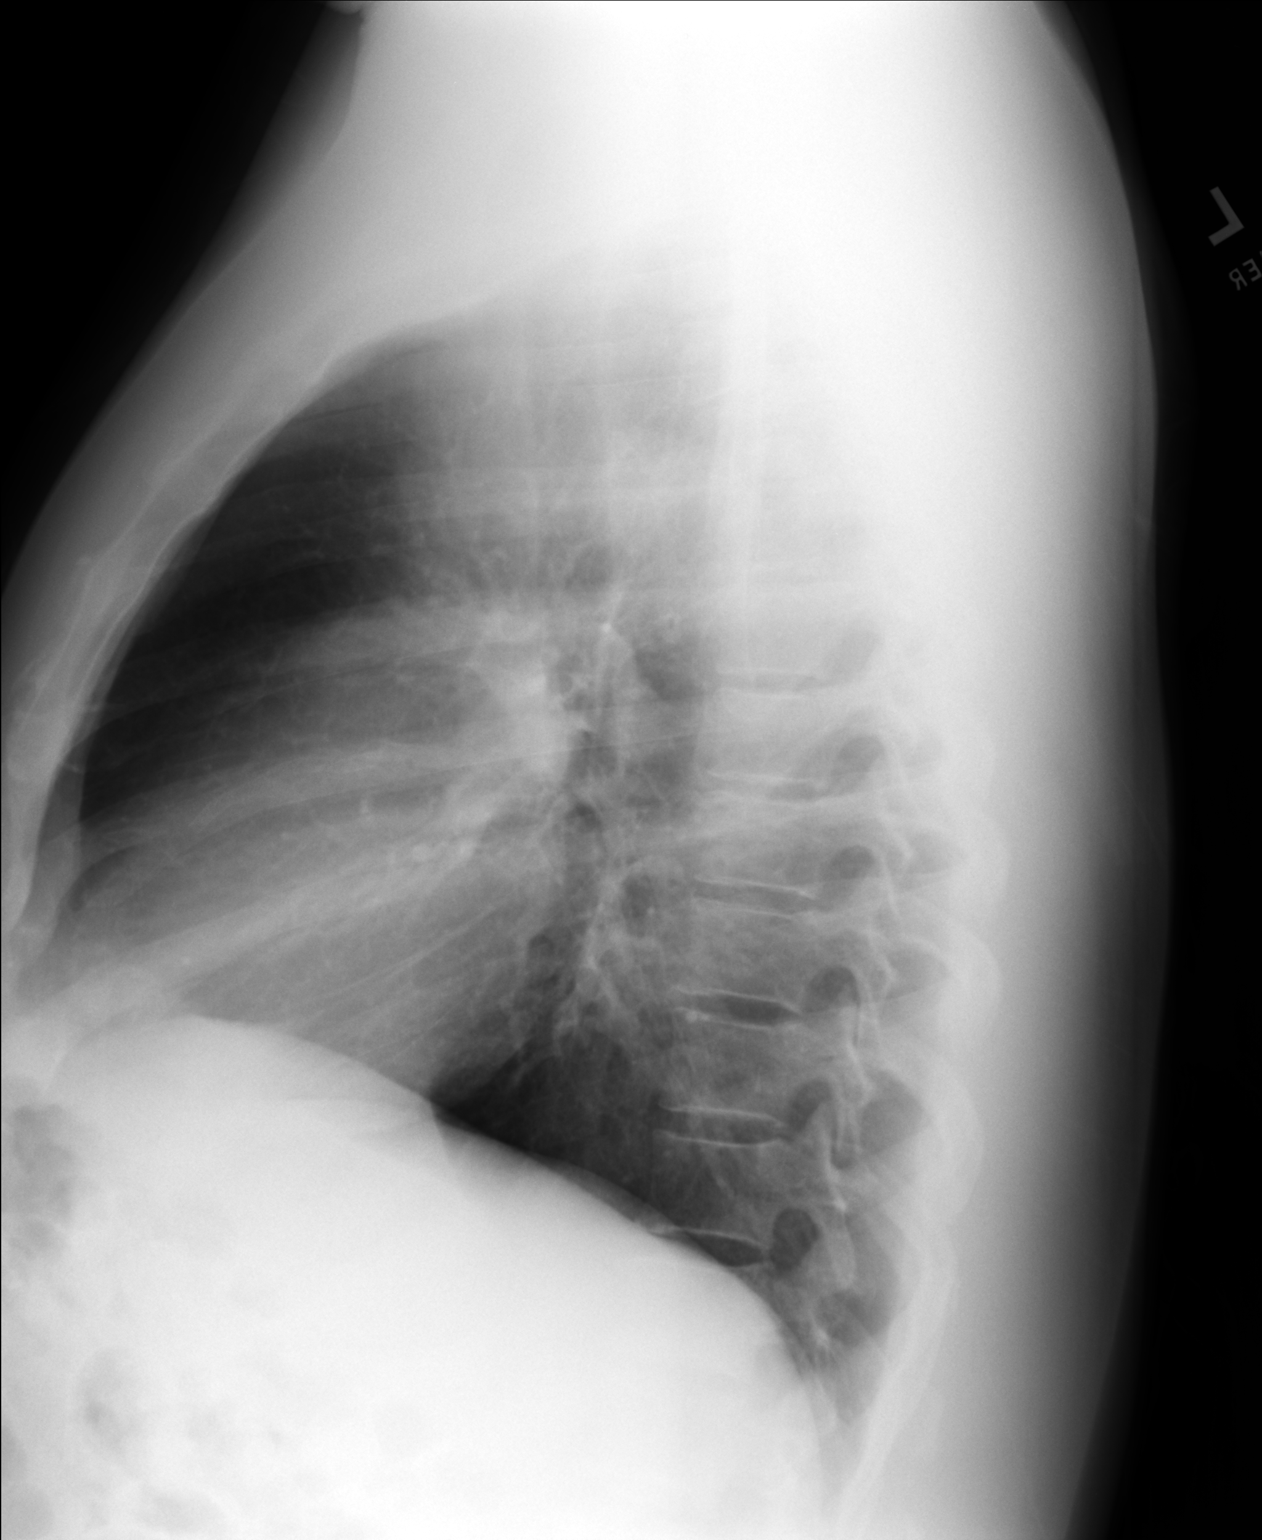

[2 of 2 positions shown; findings below may reference images not displayed]

FINDINGS: The heart size and mediastinal contours are within normal limits.
Both lungs are clear. The visualized skeletal structures are
unremarkable.
IMPRESSION: No active cardiopulmonary disease.

## 2016-01-09 DIAGNOSIS — Z1159 Encounter for screening for other viral diseases: Secondary | ICD-10-CM | POA: Diagnosis not present

## 2016-01-09 DIAGNOSIS — E785 Hyperlipidemia, unspecified: Secondary | ICD-10-CM | POA: Diagnosis not present

## 2016-01-09 DIAGNOSIS — Z0189 Encounter for other specified special examinations: Secondary | ICD-10-CM | POA: Diagnosis not present

## 2016-02-04 DIAGNOSIS — H40003 Preglaucoma, unspecified, bilateral: Secondary | ICD-10-CM | POA: Diagnosis not present

## 2016-02-04 DIAGNOSIS — H5203 Hypermetropia, bilateral: Secondary | ICD-10-CM | POA: Diagnosis not present

## 2016-02-04 DIAGNOSIS — H524 Presbyopia: Secondary | ICD-10-CM | POA: Diagnosis not present

## 2016-02-04 DIAGNOSIS — H52203 Unspecified astigmatism, bilateral: Secondary | ICD-10-CM | POA: Diagnosis not present

## 2016-03-01 DIAGNOSIS — H524 Presbyopia: Secondary | ICD-10-CM | POA: Diagnosis not present

## 2016-04-27 ENCOUNTER — Observation Stay (HOSPITAL_COMMUNITY): Payer: Federal, State, Local not specified - PPO | Admitting: Anesthesiology

## 2016-04-27 ENCOUNTER — Inpatient Hospital Stay (HOSPITAL_COMMUNITY)
Admission: EM | Admit: 2016-04-27 | Discharge: 2016-05-01 | DRG: 378 | Disposition: A | Discharge status: Home / Self Care | Payer: Federal, State, Local not specified - PPO | Attending: Family Medicine | Admitting: Family Medicine

## 2016-04-27 ENCOUNTER — Encounter (HOSPITAL_COMMUNITY)
Admission: EM | Disposition: A | Discharge status: Home / Self Care | Payer: Self-pay | Source: Home / Self Care | Attending: Family Medicine

## 2016-04-27 ENCOUNTER — Encounter (HOSPITAL_COMMUNITY): Payer: Self-pay | Admitting: *Deleted

## 2016-04-27 DIAGNOSIS — B9681 Helicobacter pylori [H. pylori] as the cause of diseases classified elsewhere: Secondary | ICD-10-CM | POA: Diagnosis present

## 2016-04-27 DIAGNOSIS — I2510 Atherosclerotic heart disease of native coronary artery without angina pectoris: Secondary | ICD-10-CM

## 2016-04-27 DIAGNOSIS — F4310 Post-traumatic stress disorder, unspecified: Secondary | ICD-10-CM | POA: Diagnosis not present

## 2016-04-27 DIAGNOSIS — E785 Hyperlipidemia, unspecified: Secondary | ICD-10-CM | POA: Diagnosis not present

## 2016-04-27 DIAGNOSIS — I951 Orthostatic hypotension: Secondary | ICD-10-CM | POA: Diagnosis not present

## 2016-04-27 DIAGNOSIS — K260 Acute duodenal ulcer with hemorrhage: Secondary | ICD-10-CM | POA: Diagnosis not present

## 2016-04-27 DIAGNOSIS — Z7902 Long term (current) use of antithrombotics/antiplatelets: Secondary | ICD-10-CM | POA: Diagnosis not present

## 2016-04-27 DIAGNOSIS — F329 Major depressive disorder, single episode, unspecified: Secondary | ICD-10-CM | POA: Diagnosis not present

## 2016-04-27 DIAGNOSIS — Z79899 Other long term (current) drug therapy: Secondary | ICD-10-CM

## 2016-04-27 DIAGNOSIS — I10 Essential (primary) hypertension: Secondary | ICD-10-CM | POA: Diagnosis not present

## 2016-04-27 DIAGNOSIS — F419 Anxiety disorder, unspecified: Secondary | ICD-10-CM | POA: Diagnosis not present

## 2016-04-27 DIAGNOSIS — K922 Gastrointestinal hemorrhage, unspecified: Secondary | ICD-10-CM | POA: Diagnosis present

## 2016-04-27 DIAGNOSIS — N179 Acute kidney failure, unspecified: Secondary | ICD-10-CM | POA: Diagnosis not present

## 2016-04-27 DIAGNOSIS — K264 Chronic or unspecified duodenal ulcer with hemorrhage: Principal | ICD-10-CM | POA: Diagnosis present

## 2016-04-27 DIAGNOSIS — Z955 Presence of coronary angioplasty implant and graft: Secondary | ICD-10-CM | POA: Diagnosis not present

## 2016-04-27 DIAGNOSIS — R55 Syncope and collapse: Secondary | ICD-10-CM

## 2016-04-27 DIAGNOSIS — Z87891 Personal history of nicotine dependence: Secondary | ICD-10-CM

## 2016-04-27 DIAGNOSIS — D500 Iron deficiency anemia secondary to blood loss (chronic): Secondary | ICD-10-CM | POA: Diagnosis present

## 2016-04-27 DIAGNOSIS — K625 Hemorrhage of anus and rectum: Secondary | ICD-10-CM | POA: Diagnosis not present

## 2016-04-27 DIAGNOSIS — R739 Hyperglycemia, unspecified: Secondary | ICD-10-CM

## 2016-04-27 DIAGNOSIS — Z7982 Long term (current) use of aspirin: Secondary | ICD-10-CM

## 2016-04-27 DIAGNOSIS — K921 Melena: Secondary | ICD-10-CM | POA: Diagnosis not present

## 2016-04-27 HISTORY — DX: Post-traumatic stress disorder, unspecified: F43.10

## 2016-04-27 HISTORY — PX: ESOPHAGOGASTRODUODENOSCOPY (EGD) WITH PROPOFOL: SHX5813

## 2016-04-27 LAB — CBC WITH DIFFERENTIAL/PLATELET
BASOS ABS: 0 10*3/uL (ref 0.0–0.1)
BASOS PCT: 0 %
EOS ABS: 0.1 10*3/uL (ref 0.0–0.7)
Eosinophils Relative: 1 %
HCT: 26.9 % — ABNORMAL LOW (ref 39.0–52.0)
Hemoglobin: 8.8 g/dL — ABNORMAL LOW (ref 13.0–17.0)
Lymphocytes Relative: 36 %
Lymphs Abs: 3.8 10*3/uL (ref 0.7–4.0)
MCH: 24.2 pg — AB (ref 26.0–34.0)
MCHC: 32.7 g/dL (ref 30.0–36.0)
MCV: 74.1 fL — ABNORMAL LOW (ref 78.0–100.0)
MONO ABS: 0.5 10*3/uL (ref 0.1–1.0)
Monocytes Relative: 5 %
NEUTROS PCT: 58 %
Neutro Abs: 6.1 10*3/uL (ref 1.7–7.7)
PLATELETS: 160 10*3/uL (ref 150–400)
RBC: 3.63 MIL/uL — AB (ref 4.22–5.81)
RDW: 15.2 % (ref 11.5–15.5)
WBC: 10.5 10*3/uL (ref 4.0–10.5)

## 2016-04-27 LAB — POC OCCULT BLOOD, ED: FECAL OCCULT BLD: POSITIVE — AB

## 2016-04-27 LAB — CBC
HEMATOCRIT: 20.3 % — AB (ref 39.0–52.0)
HEMOGLOBIN: 6.9 g/dL — AB (ref 13.0–17.0)
MCH: 24.8 pg — AB (ref 26.0–34.0)
MCHC: 34 g/dL (ref 30.0–36.0)
MCV: 73 fL — AB (ref 78.0–100.0)
Platelets: 125 10*3/uL — ABNORMAL LOW (ref 150–400)
RBC: 2.78 MIL/uL — ABNORMAL LOW (ref 4.22–5.81)
RDW: 15.3 % (ref 11.5–15.5)
WBC: 8 10*3/uL (ref 4.0–10.5)

## 2016-04-27 LAB — BASIC METABOLIC PANEL
Anion gap: 7 (ref 5–15)
BUN: 42 mg/dL — ABNORMAL HIGH (ref 6–20)
CALCIUM: 8.4 mg/dL — AB (ref 8.9–10.3)
CHLORIDE: 115 mmol/L — AB (ref 101–111)
CO2: 17 mmol/L — ABNORMAL LOW (ref 22–32)
CREATININE: 1.3 mg/dL — AB (ref 0.61–1.24)
GFR calc non Af Amer: >60 mL/min (ref >60)
Glucose, Bld: 152 mg/dL — ABNORMAL HIGH (ref 65–99)
Potassium: 3.7 mmol/L (ref 3.5–5.1)
SODIUM: 139 mmol/L (ref 135–145)

## 2016-04-27 LAB — I-STAT CHEM 8, ED
BUN: 40 mg/dL — ABNORMAL HIGH (ref 6–20)
CALCIUM ION: 1.18 mmol/L (ref 1.15–1.40)
CHLORIDE: 111 mmol/L (ref 101–111)
Creatinine, Ser: 1.2 mg/dL (ref 0.61–1.24)
Glucose, Bld: 151 mg/dL — ABNORMAL HIGH (ref 65–99)
HCT: 27 % — ABNORMAL LOW (ref 39.0–52.0)
Hemoglobin: 9.2 g/dL — ABNORMAL LOW (ref 13.0–17.0)
Potassium: 3.7 mmol/L (ref 3.5–5.1)
SODIUM: 141 mmol/L (ref 135–145)
TCO2: 20 mmol/L (ref 0–100)

## 2016-04-27 LAB — I-STAT TROPONIN, ED: Troponin i, poc: 0 ng/mL (ref 0.00–0.08)

## 2016-04-27 LAB — ABO/RH: ABO/RH(D): O POS

## 2016-04-27 LAB — CLOTEST (H. PYLORI), BIOPSY: Helicobacter screen: POSITIVE — AB

## 2016-04-27 LAB — TROPONIN I

## 2016-04-27 LAB — PROTIME-INR
INR: 1.1
PROTHROMBIN TIME: 14.3 s (ref 11.4–15.2)

## 2016-04-27 LAB — PREPARE RBC (CROSSMATCH)

## 2016-04-27 SURGERY — ESOPHAGOGASTRODUODENOSCOPY (EGD) WITH PROPOFOL
Anesthesia: Monitor Anesthesia Care

## 2016-04-27 MED ORDER — SODIUM CHLORIDE 0.9% FLUSH
3.0000 mL | Freq: Two times a day (BID) | INTRAVENOUS | Status: DC
  Administered 2016-04-29 – 2016-05-01 (×2): 3 mL via INTRAVENOUS

## 2016-04-27 MED ORDER — SODIUM CHLORIDE 0.9 % IV SOLN
Freq: Once | INTRAVENOUS | Status: AC
  Administered 2016-04-27: 17:00:00 via INTRAVENOUS

## 2016-04-27 MED ORDER — FENTANYL CITRATE (PF) 100 MCG/2ML IJ SOLN
INTRAMUSCULAR | Status: DC | PRN
  Administered 2016-04-27: 50 ug via INTRAVENOUS

## 2016-04-27 MED ORDER — BUTAMBEN-TETRACAINE-BENZOCAINE 2-2-14 % EX AERO
INHALATION_SPRAY | CUTANEOUS | Status: DC | PRN
  Administered 2016-04-27: 2 via TOPICAL

## 2016-04-27 MED ORDER — PANTOPRAZOLE SODIUM 40 MG PO TBEC
40.0000 mg | DELAYED_RELEASE_TABLET | Freq: Two times a day (BID) | ORAL | Status: DC
  Administered 2016-04-27 – 2016-05-01 (×8): 40 mg via ORAL
  Filled 2016-04-27 (×8): qty 1

## 2016-04-27 MED ORDER — LIDOCAINE HCL (CARDIAC) 20 MG/ML IV SOLN
INTRAVENOUS | Status: DC | PRN
  Administered 2016-04-27: 50 mg via INTRAVENOUS

## 2016-04-27 MED ORDER — SODIUM CHLORIDE 0.9 % IV BOLUS (SEPSIS)
1000.0000 mL | Freq: Once | INTRAVENOUS | Status: AC
  Administered 2016-04-27: 1000 mL via INTRAVENOUS

## 2016-04-27 MED ORDER — OXYCODONE HCL 5 MG PO TABS
5.0000 mg | ORAL_TABLET | ORAL | Status: DC | PRN
  Administered 2016-04-29 – 2016-04-30 (×2): 5 mg via ORAL
  Filled 2016-04-27 (×2): qty 1

## 2016-04-27 MED ORDER — ONDANSETRON HCL 4 MG/2ML IJ SOLN: 4.0000 mg | Freq: Four times a day (QID) | INTRAMUSCULAR | Status: DC | PRN

## 2016-04-27 MED ORDER — ACETAMINOPHEN 650 MG RE SUPP: 650.0000 mg | Freq: Four times a day (QID) | RECTAL | Status: DC | PRN

## 2016-04-27 MED ORDER — PANTOPRAZOLE SODIUM 40 MG IV SOLR
8.0000 mg/h | INTRAVENOUS | Status: DC
  Administered 2016-04-27: 8 mg/h via INTRAVENOUS
  Filled 2016-04-27 (×3): qty 80

## 2016-04-27 MED ORDER — ACETAMINOPHEN 325 MG PO TABS
650.0000 mg | ORAL_TABLET | Freq: Four times a day (QID) | ORAL | Status: DC | PRN
  Administered 2016-04-27 – 2016-05-01 (×3): 650 mg via ORAL
  Filled 2016-04-27 (×3): qty 2

## 2016-04-27 MED ORDER — TRAZODONE HCL 100 MG PO TABS
200.0000 mg | ORAL_TABLET | Freq: Every day | ORAL | Status: DC
  Administered 2016-04-27 – 2016-04-30 (×4): 200 mg via ORAL
  Filled 2016-04-27 (×4): qty 2

## 2016-04-27 MED ORDER — SODIUM CHLORIDE 0.9 % IV SOLN
INTRAVENOUS | Status: DC
  Administered 2016-04-27: 07:00:00 via INTRAVENOUS

## 2016-04-27 MED ORDER — SODIUM CHLORIDE 0.9 % IV SOLN
INTRAVENOUS | Status: AC
  Administered 2016-04-27: 09:00:00 via INTRAVENOUS

## 2016-04-27 MED ORDER — SODIUM CHLORIDE 0.9% FLUSH
3.0000 mL | Freq: Two times a day (BID) | INTRAVENOUS | Status: DC
  Administered 2016-04-27 – 2016-04-30 (×5): 3 mL via INTRAVENOUS

## 2016-04-27 MED ORDER — CARVEDILOL 6.25 MG PO TABS: 6.2500 mg | ORAL_TABLET | Freq: Two times a day (BID) | ORAL | Status: DC

## 2016-04-27 MED ORDER — SODIUM CHLORIDE 0.9 % IV SOLN
80.0000 mg | Freq: Once | INTRAVENOUS | Status: AC
  Administered 2016-04-27: 80 mg via INTRAVENOUS
  Filled 2016-04-27 (×2): qty 80

## 2016-04-27 MED ORDER — LIDOCAINE HCL (CARDIAC) 20 MG/ML IV SOLN: INTRAVENOUS | Status: DC | PRN

## 2016-04-27 MED ORDER — ROSUVASTATIN CALCIUM 10 MG PO TABS
5.0000 mg | ORAL_TABLET | Freq: Every day | ORAL | Status: DC
  Administered 2016-04-29 – 2016-05-01 (×2): 5 mg via ORAL
  Filled 2016-04-27: qty 1
  Filled 2016-04-27: qty 0.5
  Filled 2016-04-27 (×5): qty 1

## 2016-04-27 MED ORDER — ONDANSETRON HCL 4 MG/2ML IJ SOLN: 4.0000 mg | Freq: Three times a day (TID) | INTRAMUSCULAR | Status: DC | PRN

## 2016-04-27 MED ORDER — LACTATED RINGERS IV SOLN
INTRAVENOUS | Status: DC | PRN
  Administered 2016-04-27: 10:00:00 via INTRAVENOUS

## 2016-04-27 MED ORDER — CLARITHROMYCIN 500 MG PO TABS
500.0000 mg | ORAL_TABLET | Freq: Two times a day (BID) | ORAL | Status: DC
  Administered 2016-04-27 – 2016-05-01 (×9): 500 mg via ORAL
  Filled 2016-04-27 (×9): qty 1

## 2016-04-27 MED ORDER — SERTRALINE HCL 100 MG PO TABS
200.0000 mg | ORAL_TABLET | Freq: Every day | ORAL | Status: DC
  Filled 2016-04-27 (×6): qty 2

## 2016-04-27 MED ORDER — ONDANSETRON HCL 4 MG PO TABS: 4.0000 mg | ORAL_TABLET | Freq: Four times a day (QID) | ORAL | Status: DC | PRN

## 2016-04-27 MED ORDER — AMOXICILLIN 500 MG PO CAPS
1000.0000 mg | ORAL_CAPSULE | Freq: Two times a day (BID) | ORAL | Status: DC
Start: 2016-04-27 — End: 2016-05-01
  Administered 2016-04-27 – 2016-05-01 (×9): 1000 mg via ORAL
  Filled 2016-04-27 (×9): qty 2

## 2016-04-27 NOTE — Op Note (Signed)
[facility] Patient Name: Sean HartsGilbert Strong Procedure Date : 04/27/2016 MRN: [id number] Attending MD: Barrie FolkJohn C Abbygale Lapid , MD Date of Birth: 09/15/1965 CSN: [id number] Age: 50 Admit Type: Inpatient Procedure:                Upper GI endoscopy Indications:              Melena Providers:                Everardo AllJohn C. Madilyn FiremanHayes, MD, Jacquiline DoeJennifer Zhu, RN, Beryle BeamsJanie Billups,                            Technician, Coralee Rudobert Flores, CRNA Referring MD:              Medicines:                Propofol per Anesthesia Complications:            No immediate complications. Estimated Blood Loss:     Estimated blood loss: none. Procedure:                Pre-Anesthesia Assessment:                           - Prior to the procedure, a History and Physical                            was performed, and patient medications and                            allergies were reviewed. The patient's tolerance of                            previous anesthesia was also reviewed. The risks                            and benefits of the procedure and the sedation                            options and risks were discussed with the patient.                            All questions were answered, and informed consent                            was obtained. Prior Anticoagulants: The patient has                            taken Plavix (clopidogrel), last dose was 2 days                            prior to procedure. ASA Grade Assessment: III - A                            patient with severe systemic disease. After  reviewing the risks and benefits, the patient was                            deemed in satisfactory condition to undergo the                            procedure.                           After obtaining informed consent, the endoscope was                            passed under direct vision. Throughout the                            procedure, the patient's blood pressure, pulse, and                  oxygen saturations were monitored continuously. The                            EG-[id number] ([id number]) scope was introduced through the                            mouth, and advanced to the second part of duodenum.                            The upper GI endoscopy was accomplished without                            difficulty. The patient tolerated the procedure                            well. Scope In: Scope Out: Findings:      The examined esophagus was normal.      A few localized, small non-bleeding erosions were found at the incisura       and in the gastric antrum. There were no stigmata of recent bleeding.       Biopsies were taken with a cold forceps for Helicobacter pylori testing       using CLOtest.      One non-bleeding cratered duodenal ulcer with pigmented material was       found in the duodenal bulb. The lesion was 6 mm in largest dimension.      The exam was otherwise without abnormality. Impression:               - Normal esophagus.                           - Non-bleeding erosive gastropathy. Biopsied.                           - One non-bleeding duodenal ulcer with pigmented                            material.                           -  The examination was otherwise normal. Moderate Sedation:      no moderate sedation Recommendation:           - Await pathology results.                           - Continue present medications.                           - Observe patient's clinical course.                           - Resume previous diet. Procedure Code(s):        --- Professional ---                           [phone], Esophagogastroduodenoscopy, flexible,                            transoral; with biopsy, single or multiple Diagnosis Code(s):        --- Professional ---                           K31.89, Other diseases of stomach and duodenum                           K26.9, Duodenal ulcer, unspecified as acute or                            chronic,  without hemorrhage or perforation                           K92.1, Melena (includes Hematochezia) CPT copyright 2016 American Medical Association. All rights reserved. The codes documented in this report are preliminary and upon coder review may  be revised to meet current compliance requirements. Barrie Folk, MD 04/27/2016 10:55:56 AM This report has been signed electronically. Number of Addenda: 0

## 2016-04-27 NOTE — Transfer of Care (Signed)
Immediate Anesthesia Transfer of Care Note  Patient: Sean Strong  Procedure(s) Performed: Procedure(s): ESOPHAGOGASTRODUODENOSCOPY (EGD) WITH PROPOFOL (N/A)  Patient Location: PACU and Endoscopy Unit  Anesthesia Type:MAC  Level of Consciousness: awake, alert  and oriented  Airway & Oxygen Therapy: Patient Spontanous Breathing and Patient connected to nasal cannula oxygen  Post-op Assessment: Report given to RN, Post -op Vital signs reviewed and stable and Patient moving all extremities  Post vital signs: Reviewed and stable  Last Vitals:  Vitals:   04/27/16 1016 04/27/16 1102  BP: 112/66 (!) 102/58  Pulse: 90 92  Resp: 13 15  Temp: 36.8 C     Last Pain:  Vitals:   04/27/16 1016  TempSrc: Oral  PainSc:          Complications: No apparent anesthesia complications

## 2016-04-27 NOTE — Progress Notes (Signed)
This is a no charge note  Pending admission per PA, Angelica Chessmanapia  50 year old man with past medical history of PTSD, depression, anxiety, hyperlipidemia, CAD on burning, and aspirin, who presents with dark stool for a few days. Patient also has mild crampy abdominal pain, dizziness and syncope episode. FOBT positive. Hgb dropped from 13.4 on 08/01/13-->8.8 today. Initially blood pressure was soft 93/70, which improved to 103/66 after 1 L normal saline bolus. Heart rate 90s..  WBC 10.5, temperature normal, AKi with Cre 1.30, INR 1.10. Pt is accepted to tele bed for obs. GI will be consulted by EDP. Protonix gtt was started.   Lorretta HarpXilin Benelli Winther, MD  [facility] Pager [phone]  If 7PM-7AM, please contact night-coverage [email or web address] Password [facility] 04/27/2016, 6:58 AM

## 2016-04-27 NOTE — H&P (Signed)
History and Physical    Sean Strong ZOX:[id number] DOB: Sep 25, 1965 DOA: 04/27/2016  PCP: No PCP Per Patient Patient coming from: home  Chief Complaint: bloody stools  HPI: Sean Strong is a 50 y.o. male with medical history significant of  PTSD/depression/anxiety, HLD, CAD presenting w/ several day history of passing dark stools. On ASA and plavix. Associated w/ crampy abd pain, dizziness and single syncopal episode. Patient reports sitting on the toilet to have a bowel movement when he's having passed out. Denies any presyncopal symptoms such as chest pain, palpitations, shortness of breath, vertigo, lightheadedness. Denies hitting his head. Wife witnesses episode and states he was unconscious for very short period of time. Denies any limb shaking, loss of bowel or bladder function, or tongue biting. No postictal state.  Last colonoscopy 1 year ago at the [place] for patient gets the majority of his care.    ED Course: FOBT +. Hypotension responded to IVF. Hgb dropped from 13.4 baseline to 8.8. Started on protonix. GI consulted by EDP  Review of Systems: As per HPI otherwise 10 point review of systems negative.   Ambulatory Status: no restrictions  Past Medical History:  Diagnosis Date  . Anxiety   . Coronary artery disease   . Depression   . PTSD (post-traumatic stress disorder)     Past Surgical History:  Procedure Laterality Date  . CARDIAC CATHETERIZATION  07/31/2013  . COSMETIC SURGERY    . LEFT HEART CATHETERIZATION WITH CORONARY ANGIOGRAM N/A 07/31/2013   Procedure: LEFT HEART CATHETERIZATION WITH CORONARY ANGIOGRAM;  Surgeon: Pamella Pert, MD;  Location: [facility] CATH LAB;  Service: Cardiovascular;  Laterality: N/A;  . PERCUTANEOUS CORONARY STENT INTERVENTION (PCI-S)  07/31/2013   MID & PROXIMAL LAD       DR Jacinto Halim  . PERCUTANEOUS CORONARY STENT INTERVENTION (PCI-S)  07/31/2013   Procedure: PERCUTANEOUS CORONARY STENT INTERVENTION (PCI-S);  Surgeon: Pamella Pert,  MD;  Location: [facility] CATH LAB;  Service: Cardiovascular;;    Social History   Social History  . Marital status: Married    Spouse name: N/A  . Number of children: N/A  . Years of education: N/A   Occupational History  . Not on file.   Social History Main Topics  . Smoking status: Former Smoker    Years: 10.00    Types: Cigarettes    Quit date: 07/23/2012  . Smokeless tobacco: Never Used  . Alcohol use No  . Drug use: No  . Sexual activity: No   Other Topics Concern  . Not on file   Social History Narrative  . No narrative on file    No Known Allergies  Family History  Problem Relation Age of Onset  . Diabetes Mother   . Heart disease Mother   . Kidney failure Mother   . Heart disease Sister     Prior to Admission medications   Medication Sig Start Date End Date Taking? Authorizing Provider  aspirin 81 MG chewable tablet Chew 81 mg by mouth every morning.    Yes Historical Provider, MD  carvedilol (COREG) 6.25 MG tablet Take 6.25 mg by mouth 2 (two) times daily with a meal.   Yes Historical Provider, MD  clopidogrel (PLAVIX) 75 MG tablet Take 75 mg by mouth daily.   Yes Historical Provider, MD  nitroGLYCERIN (NITROSTAT) 0.4 MG SL tablet Place 0.4 mg under the tongue every 5 (five) minutes as needed for chest pain.   Yes Historical Provider, MD  rosuvastatin (CRESTOR) 5 MG  tablet Take 5 mg by mouth daily.   Yes Historical Provider, MD  sertraline (ZOLOFT) 100 MG tablet Take 200 mg by mouth daily.    Yes Historical Provider, MD  traZODone (DESYREL) 100 MG tablet Take 200 mg by mouth at bedtime.    Yes Historical Provider, MD    Physical Exam: Vitals:   04/27/16 0630 04/27/16 0645 04/27/16 0900 04/27/16 0906  BP: 107/65 117/66  111/67  Pulse: 85 81  84  Resp: 22 13  20   Temp:    98.1 F (36.7 C)  TempSrc:    Oral  SpO2: 100% 100%  100%  Height:   5\' 10"  (1.778 m)      General:  Appears calm and comfortable Eyes:  PERRL, EOMI, normal lids, iris ENT:  grossly  normal hearing, lips & tongue, mmm Neck:  no LAD, masses or thyromegaly Cardiovascular:  RRR, no m/r/g. No LE edema.  Respiratory:  CTA bilaterally, no w/r/r. Normal respiratory effort. Abdomen:  soft, ntnd, NABS Skin:  no rash or induration seen on limited exam Musculoskeletal:  grossly normal tone BUE/BLE, good ROM, no bony abnormality Psychiatric:  grossly normal mood and affect, speech fluent and appropriate, AOx3 Neurologic:  CN 2-12 grossly intact, moves all extremities in coordinated fashion, sensation intact  Labs on Admission: I have personally reviewed following labs and imaging studies  CBC:  Recent Labs Lab 04/27/16 0530 04/27/16 0538  WBC 10.5  --   NEUTROABS 6.1  --   HGB 8.8* 9.2*  HCT 26.9* 27.0*  MCV 74.1*  --   PLT 160  --    Basic Metabolic Panel:  Recent Labs Lab 04/27/16 0530 04/27/16 0538  NA 139 141  K 3.7 3.7  CL 115* 111  CO2 17*  --   GLUCOSE 152* 151*  BUN 42* 40*  CREATININE 1.30* 1.20  CALCIUM 8.4*  --    GFR: CrCl cannot be calculated (Unknown ideal weight.). Liver Function Tests: No results for input(s): AST, ALT, ALKPHOS, BILITOT, PROT, ALBUMIN in the last 168 hours. No results for input(s): LIPASE, AMYLASE in the last 168 hours. No results for input(s): AMMONIA in the last 168 hours. Coagulation Profile:  Recent Labs Lab 04/27/16 0530  INR 1.10   Cardiac Enzymes: No results for input(s): CKTOTAL, CKMB, CKMBINDEX, TROPONINI in the last 168 hours. BNP (last 3 results) No results for input(s): PROBNP in the last 8760 hours. HbA1C: No results for input(s): HGBA1C in the last 72 hours. CBG: No results for input(s): GLUCAP in the last 168 hours. Lipid Profile: No results for input(s): CHOL, HDL, LDLCALC, TRIG, CHOLHDL, LDLDIRECT in the last 72 hours. Thyroid Function Tests: No results for input(s): TSH, T4TOTAL, FREET4, T3FREE, THYROIDAB in the last 72 hours. Anemia Panel: No results for input(s): VITAMINB12, FOLATE,  FERRITIN, TIBC, IRON, RETICCTPCT in the last 72 hours. Urine analysis: No results found for: COLORURINE, APPEARANCEUR, LABSPEC, PHURINE, GLUCOSEU, HGBUR, BILIRUBINUR, KETONESUR, PROTEINUR, UROBILINOGEN, NITRITE, LEUKOCYTESUR  Creatinine Clearance: CrCl cannot be calculated (Unknown ideal weight.).  Sepsis Labs: @LABRCNTIP (procalcitonin:4,lacticidven:4) )No results found for this or any previous visit (from the past 240 hour(s)).   Radiological Exams on Admission: No results found.  EKG: Independently reviewed.   Assessment/Plan Active Problems:   PTSD (post-traumatic stress disorder)   Upper GI bleed   Syncope   H/O heart artery stent   CAD (coronary artery disease)   Essential hypertension   Hyperglycemia   GI bleed: Suspect upper GI bleed given the description of stools, normal  colonoscopy one year ago, increased BUN/creatinine ratio. Evaluated by Dr. Madilyn FiremanHayes of GI who is planning on EGD at 11:00 on 04/27/16 - NPO until after scope - continue PPI drip - hold ASA, Plavix - CBC 14:00, and am - f/u GI recs  AKI vs CKD: Creatinine 1.3, BUN 42. Last known values from 2014 showed creatinine of 1.0 and BUN of 9. Improvement with IVF. - IVF - BM P in a.m.  Syncope: Likely from a combination of orthostatic hypotension versus vasovagal episode from low intravascular volume from brisk GI bleed and anemia. No evidence of seizure, metabolic derangement, arrhythmia, infection, or other intracranial process causing symptoms. EKG showing normal sinus rhythm - Telemetry - Orthostatics - IVF - Echo - Head CT if develops further symptomatology  CAD status post stent placement: Patient receives the predominance of his medical [facility]. On aspirin and Plavix - currently on hold due to GI bleed - Resume aspirin and Plavix once cleared by GI - Telemetry - Cycle troponins - continue statin   Hypertension: hypotensive from acute anemia - Continue carvedilol when BP  stable  Hyperglycemia: no h/o DM but strong fmhx.  - A1c - CBG  PTSD/anxiety/depression/insomnia: At baseline. Patient former [employer] [facility]   DVT prophylaxis: SCD  Code Status: full  Family Communication: wife  Disposition Plan: pending EGD and resolution of GI bleed  Consults called: GI - Dr Madilyn FiremanHayes  Admission status: obs    MERRELL, DAVID J MD [facility]  If 7PM-7AM, please contact night-coverage [email or web address] Password [email or web address]  04/27/2016, 9:10 AM

## 2016-04-27 NOTE — Anesthesia Procedure Notes (Signed)
Procedure Name: MAC Date/Time: 04/27/2016 10:44 AM Performed by: Coralee RudFLORES, Anela Bensman Pre-anesthesia Checklist: Patient identified and Patient being monitored Placement Confirmation: positive ETCO2

## 2016-04-27 NOTE — ED Provider Notes (Signed)
Medical screening examination/treatment/procedure(s) were conducted as a shared visit with non-physician practitioner(s) and myself.  I personally evaluated the patient during the encounter.   EKG Interpretation  Date/Time:  Tuesday April 27 2016 [id number]:10:[id number] EDT Ventricular Rate:  93 PR Interval:    QRS Duration: 90 QT Interval:  356 QTC Calculation: 443 R Axis:   49 Text Interpretation:  Sinus rhythm Abnormal R-wave progression, early transition No significant change since last tracing Confirmed by Zanaiya Calabria,  DO, Izamar Linden [phone]([id number]) on 04/27/2016 5:39:10 AM       Patient is a 50 year old male male who is on aspirin and Plavix who presents to the emergency room 2 days of melena. No abdominal pain. Has [facility] the past [facility]. No history of endoscopy. Does not have a history of hypertension is not on medication for blood pressure. No history of NSAID use other than aspirin regimen, alcohol abuse. Did have 2-3 episodes of black tarry stools this morning and had a syncopal event. No preceding chest pain or shortness of breath. Patient was initially mildly hypotensive that is improving with IV fluids. Hemoglobin is 8.8 which is down compared to his prior approximate 2 years ago. We'll consult GI. He is receiving Protonix. Will admit to medicine.   Layla MawKristen N Deerica Waszak, DO 04/27/16 [id number]

## 2016-04-27 NOTE — Consult Note (Signed)
[facility] Consult Note  Referring Provider: No ref. provider found Primary Care Physician:  No PCP Per Patient Primary Gastroenterologist:  Dr.  Laurel Dimmer Complaint: Melena HPI: Sean Strong is an 50 y.o. black male  with history of coronary artery disease status post coronary stent placement 2, on Plavix and aspirin who presents with dark stool and dizziness with near syncopal episode. Hemoglobin was 8.8 with BUN of 40 with a creatinine of 1.1. Blood pressure was initially borderline. He is had nothing to eat since yesterday. He denies any abdominal pain. He had a colonoscopy one or 2 years ago at the [facility] which was unremarkable.  Past Medical History:  Diagnosis Date  . Anxiety   . Coronary artery disease   . Depression   . PTSD (post-traumatic stress disorder)     Past Surgical History:  Procedure Laterality Date  . CARDIAC CATHETERIZATION  07/31/2013  . COSMETIC SURGERY    . LEFT HEART CATHETERIZATION WITH CORONARY ANGIOGRAM N/A 07/31/2013   Procedure: LEFT HEART CATHETERIZATION WITH CORONARY ANGIOGRAM;  Surgeon: Laverda Page, MD;  Location: [facility] CATH LAB;  Service: Cardiovascular;  Laterality: N/A;  . PERCUTANEOUS CORONARY STENT INTERVENTION (PCI-S)  07/31/2013   MID & PROXIMAL LAD       DR Einar Gip  . PERCUTANEOUS CORONARY STENT INTERVENTION (PCI-S)  07/31/2013   Procedure: PERCUTANEOUS CORONARY STENT INTERVENTION (PCI-S);  Surgeon: Laverda Page, MD;  Location: [facility] CATH LAB;  Service: Cardiovascular;;    Medications Prior to Admission  Medication Sig Dispense Refill  . aspirin 81 MG chewable tablet Chew 81 mg by mouth every morning.     . carvedilol (COREG) 6.25 MG tablet Take 6.25 mg by mouth 2 (two) times daily with a meal.    . clopidogrel (PLAVIX) 75 MG tablet Take 75 mg by mouth daily.    . nitroGLYCERIN (NITROSTAT) 0.4 MG SL tablet Place 0.4 mg under the tongue every 5 (five) minutes as needed for chest pain.    . rosuvastatin (CRESTOR) 5 MG  tablet Take 5 mg by mouth daily.    . sertraline (ZOLOFT) 100 MG tablet Take 200 mg by mouth daily.     . traZODone (DESYREL) 100 MG tablet Take 200 mg by mouth at bedtime.       Allergies: No Known Allergies  Family History  Problem Relation Age of Onset  . Diabetes Mother   . Heart disease Mother   . Heart disease Sister     Social History:  reports that he quit smoking about 3 years ago. His smoking use included Cigarettes. He quit after 10.00 years of use. He has never used smokeless tobacco. He reports that he does not drink alcohol or use drugs.  Review of Systems: negative except as above   Blood pressure 117/66, pulse 81, temperature 97.5 F (36.4 C), temperature source Oral, resp. rate 13, SpO2 100 %. Head: Normocephalic, without obvious abnormality, atraumatic Neck: no adenopathy, no carotid bruit, no JVD, supple, symmetrical, trachea midline and thyroid not enlarged, symmetric, no tenderness/mass/nodules Resp: clear to auscultation bilaterally Cardio: regular rate and rhythm, S1, S2 normal, no murmur, click, rub or gallop GI: Abdomen soft nondistended nontender with normoactive bowel sounds. No hepatosplenomegaly mass or guarding. Extremities: extremities normal, atraumatic, no cyanosis or edema  Results for orders placed or performed during the hospital encounter of 04/27/16 (from the past 48 hour(s))  POC occult blood, ED     Status: Abnormal   Collection Time: 04/27/16  5:26 AM  Result Value Ref Range   Fecal Occult Bld POSITIVE (A) NEGATIVE  Type and screen [place]     Status: None   Collection Time: 04/27/16  5:30 AM  Result Value Ref Range   ABO/RH(D) O POS    Antibody Screen NEG    Sample Expiration 04/30/2016   CBC WITH DIFFERENTIAL     Status: Abnormal   Collection Time: 04/27/16  5:30 AM  Result Value Ref Range   WBC 10.5 4.0 - 10.5 K/uL   RBC 3.63 (L) 4.22 - 5.81 MIL/uL   Hemoglobin 8.8 (L) 13.0 - 17.0 g/dL   HCT 26.9 (L) 39.0 -  52.0 %   MCV 74.1 (L) 78.0 - 100.0 fL   MCH 24.2 (L) 26.0 - 34.0 pg   MCHC 32.7 30.0 - 36.0 g/dL   RDW 15.2 11.5 - 15.5 %   Platelets 160 150 - 400 K/uL   Neutrophils Relative % 58 %   Lymphocytes Relative 36 %   Monocytes Relative 5 %   Eosinophils Relative 1 %   Basophils Relative 0 %   Neutro Abs 6.1 1.7 - 7.7 K/uL   Lymphs Abs 3.8 0.7 - 4.0 K/uL   Monocytes Absolute 0.5 0.1 - 1.0 K/uL   Eosinophils Absolute 0.1 0.0 - 0.7 K/uL   Basophils Absolute 0.0 0.0 - 0.1 K/uL   RBC Morphology POLYCHROMASIA PRESENT   Basic metabolic panel     Status: Abnormal   Collection Time: 04/27/16  5:30 AM  Result Value Ref Range   Sodium 139 135 - 145 mmol/L   Potassium 3.7 3.5 - 5.1 mmol/L   Chloride 115 (H) 101 - 111 mmol/L   CO2 17 (L) 22 - 32 mmol/L   Glucose, Bld 152 (H) 65 - 99 mg/dL   BUN 42 (H) 6 - 20 mg/dL   Creatinine, Ser 1.30 (H) 0.61 - 1.24 mg/dL   Calcium 8.4 (L) 8.9 - 10.3 mg/dL   GFR calc non Af Amer >60 >60 mL/min   GFR calc Af Amer >60 >60 mL/min    Comment: (NOTE) The eGFR has been calculated using the CKD EPI equation. This calculation has not been validated in all clinical situations. eGFR's persistently <60 mL/min signify possible Chronic Kidney Disease.    Anion gap 7 5 - 15  Protime-INR     Status: None   Collection Time: 04/27/16  5:30 AM  Result Value Ref Range   Prothrombin Time 14.3 11.4 - 15.2 seconds   INR 1.10   ABO/Rh     Status: None   Collection Time: 04/27/16  5:30 AM  Result Value Ref Range   ABO/RH(D) O POS   I-stat troponin, ED     Status: None   Collection Time: 04/27/16  5:36 AM  Result Value Ref Range   Troponin i, poc 0.00 0.00 - 0.08 ng/mL   Comment 3            Comment: Due to the release kinetics of cTnI, a negative result within the first hours of the onset of symptoms does not rule out myocardial infarction with certainty. If myocardial infarction is still suspected, repeat the test at appropriate intervals.   I-Stat Chem 8, ED   (not at [facility]     Status: Abnormal   Collection Time: 04/27/16  5:38 AM  Result Value Ref Range   Sodium 141 135 - 145 mmol/L   Potassium 3.7 3.5 - 5.1 mmol/L   Chloride 111 101 -  111 mmol/L   BUN 40 (H) 6 - 20 mg/dL   Creatinine, Ser 1.20 0.61 - 1.24 mg/dL   Glucose, Bld 151 (H) 65 - 99 mg/dL   Calcium, Ion 1.18 1.15 - 1.40 mmol/L   TCO2 20 0 - 100 mmol/L   Hemoglobin 9.2 (L) 13.0 - 17.0 g/dL   HCT 27.0 (L) 39.0 - 52.0 %   No results found.  Assessment: GI bleed, suspect upper source, exacerbated by anticoagulation. Plan:  PPI initiated Hold aspirin and Plavix EGD this morning. Zac Torti C 04/27/2016, 8:16 AM  Pager [phone] If no answer or after 5 PM call [phone]

## 2016-04-27 NOTE — ED Triage Notes (Signed)
Pt to ED from home c/o dark blood from rectum for a few days. BM have become progressively more bloody. Tonight, pt was having a BM, became dizzy, and fainted, hitting R side of face on bathtub. Reports mild cramping in abdomen

## 2016-04-27 NOTE — Progress Notes (Signed)
H pylori + . Starting triple therapy Amox/Clarithro/Protonix (change from IV to PO in anticipation of outpt therapy) x14 days.   Shelly Flattenavid Kyannah Climer, MD [facility] 04/27/2016, 2:00 PM

## 2016-04-27 NOTE — ED Provider Notes (Signed)
[facility]-EMERGENCY DEPT Provider Note   CSN: [id number] Arrival date & time: 04/27/16  0453     History   Chief Complaint Chief Complaint  Patient presents with  . GI Bleeding    HPI Sean Strong is a 50 y.o. male.  HPI   Patient is a 50 year old male past medical history of CAD, s/p PCI with stents and DAPT (2014), presents emergency department with a chief complaint of dark stools for the past 3-4 days. He felt lightheaded with shortness of breath today while he attempted to work, went home early at approximately 4 PM and had multiple bowel movements with dark stool described as normally formed but black in color. He did not have any straining with bowel movements.  Just prior to arrival he was on the commode, having another bowel movement when he became dizzy and lost consciousness, subsequently hit the right side of his face onto the bathtub. His wife heard him fall and ran in to the restroom and found him "coming to," diaphoretic and cold. He felt somewhat dizzy, but does not have any visible injury from the fall.  He denies face pain, HA, neck pain.  He complains of several days of right lower abdominal cramping which is mild.  Today at work patient felt extremely lightheaded and short of breath and he was unable to continue working throughout the day. He denies chest pain, palpitations, lower extremity edema.  He reports normally eating and drinking today, with urinary frequency.  He had some streaking and dripping of bright red blood today intermittently with BM, but he denies hematochezia.  No N, V. Patient states that he has annual colonoscopies at the [place], last 2016.  No known pertinent findings.   No NSAID use no ETOH     Past Medical History:  Diagnosis Date  . Anxiety   . Coronary artery disease   . Depression   . PTSD (post-traumatic stress disorder)     Patient Active Problem List   Diagnosis Date Noted  . Upper GI bleed 04/27/2016  . S/P PTCA (percutaneous  transluminal coronary angioplasty) 07/31/2013  . PTSD (post-traumatic stress disorder) 07/24/2013  . Poor dentition 07/24/2013    Past Surgical History:  Procedure Laterality Date  . CARDIAC CATHETERIZATION  07/31/2013  . COSMETIC SURGERY    . LEFT HEART CATHETERIZATION WITH CORONARY ANGIOGRAM N/A 07/31/2013   Procedure: LEFT HEART CATHETERIZATION WITH CORONARY ANGIOGRAM;  Surgeon: Pamella Pert, MD;  Location: [facility] CATH LAB;  Service: Cardiovascular;  Laterality: N/A;  . PERCUTANEOUS CORONARY STENT INTERVENTION (PCI-S)  07/31/2013   MID & PROXIMAL LAD       DR Jacinto Halim  . PERCUTANEOUS CORONARY STENT INTERVENTION (PCI-S)  07/31/2013   Procedure: PERCUTANEOUS CORONARY STENT INTERVENTION (PCI-S);  Surgeon: Pamella Pert, MD;  Location: [facility] CATH LAB;  Service: Cardiovascular;;       Home Medications    Prior to Admission medications   Medication Sig Start Date End Date Taking? Authorizing Provider  aspirin 81 MG chewable tablet Chew 81 mg by mouth every morning.    Yes Historical Provider, MD  carvedilol (COREG) 6.25 MG tablet Take 6.25 mg by mouth 2 (two) times daily with a meal.   Yes Historical Provider, MD  clopidogrel (PLAVIX) 75 MG tablet Take 75 mg by mouth daily.   Yes Historical Provider, MD  nitroGLYCERIN (NITROSTAT) 0.4 MG SL tablet Place 0.4 mg under the tongue every 5 (five) minutes as needed for chest pain.   Yes Historical Provider,  MD  rosuvastatin (CRESTOR) 5 MG tablet Take 5 mg by mouth daily.   Yes Historical Provider, MD  sertraline (ZOLOFT) 100 MG tablet Take 200 mg by mouth daily.    Yes Historical Provider, MD  traZODone (DESYREL) 100 MG tablet Take 200 mg by mouth at bedtime.    Yes Historical Provider, MD    Family History Family History  Problem Relation Age of Onset  . Diabetes Mother   . Heart disease Mother   . Heart disease Sister     Social History Social History  Substance Use Topics  . Smoking status: Former Smoker    Years: 10.00    Types:  Cigarettes    Quit date: 07/23/2012  . Smokeless tobacco: Never Used  . Alcohol use No     Allergies   Review of patient's allergies indicates no known allergies.   Review of Systems Review of Systems  All other systems reviewed and are negative.   Physical Exam Updated Vital Signs BP 117/66   Pulse 81   Temp 97.5 F (36.4 C) (Oral)   Resp 13   SpO2 100%   Physical Exam  Constitutional: He is oriented to person, place, and time. He appears well-developed and well-nourished. No distress.  HENT:  Head: Normocephalic and atraumatic.  Right Ear: External ear normal.  Left Ear: External ear normal.  Nose: Nose normal.  Mouth/Throat: No oropharyngeal exudate.  Oral mucosa pale and moist  Eyes: Pupils are equal, round, and reactive to light. Right eye exhibits no discharge. Left eye exhibits no discharge. No scleral icterus.  Palpebra conjunctiva pallor  Neck: Normal range of motion.  Cardiovascular: Normal rate, regular rhythm and normal heart sounds.  Exam reveals no gallop and no friction rub.   No murmur heard. Pulmonary/Chest: Effort normal and breath sounds normal. No stridor. No respiratory distress. He has no wheezes. He has no rales. He exhibits no tenderness.  Abdominal: Soft. Bowel sounds are normal. He exhibits no distension and no mass. There is no tenderness. There is no guarding.  Musculoskeletal: Normal range of motion.  Lymphadenopathy:    He has no cervical adenopathy.  Neurological: He is alert and oriented to person, place, and time. He exhibits normal muscle tone. Coordination normal.  Skin: Skin is dry. Capillary refill takes more than 3 seconds. He is not diaphoretic. No cyanosis. There is pallor.  Bilateral hands cool to the touch and pale with delayed capillary refill, no cyanosis  Nursing note and vitals reviewed.   ED Treatments / Results  Labs (all labs ordered are listed, but only abnormal results are displayed) Labs Reviewed  CBC WITH  DIFFERENTIAL/PLATELET - Abnormal; Notable for the following:       Result Value   RBC 3.63 (*)    Hemoglobin 8.8 (*)    HCT 26.9 (*)    MCV 74.1 (*)    MCH 24.2 (*)    All other components within normal limits  BASIC METABOLIC PANEL - Abnormal; Notable for the following:    Chloride 115 (*)    CO2 17 (*)    Glucose, Bld 152 (*)    BUN 42 (*)    Creatinine, Ser 1.30 (*)    Calcium 8.4 (*)    All other components within normal limits  POC OCCULT BLOOD, ED - Abnormal; Notable for the following:    Fecal Occult Bld POSITIVE (*)    All other components within normal limits  I-STAT CHEM 8, ED - Abnormal; Notable for  the following:    BUN 40 (*)    Glucose, Bld 151 (*)    Hemoglobin 9.2 (*)    HCT 27.0 (*)    All other components within normal limits  PROTIME-INR  POC OCCULT BLOOD, ED  I-STAT TROPOININ, ED  TYPE AND SCREEN  ABO/RH    EKG  EKG Interpretation  Date/Time:  Tuesday April 27 2016 46:96:29 EDT Ventricular Rate:  93 PR Interval:    QRS Duration: 90 QT Interval:  356 QTC Calculation: 443 R Axis:   49 Text Interpretation:  Sinus rhythm Abnormal R-wave progression, early transition No significant change since last tracing Confirmed by WARD,  DO, KRISTEN ([id number]) on 04/27/2016 5:39:10 AM       Radiology No results found.  Procedures Procedures (including critical care time)  Medications Ordered in ED Medications  pantoprazole (PROTONIX) 80 mg in sodium chloride 0.9 % 250 mL (0.32 mg/mL) infusion (8 mg/hr Intravenous New Bag/Given 04/27/16 0552)  sodium chloride 0.9 % bolus 1,000 mL (1,000 mLs Intravenous New Bag/Given 04/27/16 0542)    And  0.9 %  sodium chloride infusion ( Intravenous New Bag/Given 04/27/16 0639)  sodium chloride 0.9 % bolus 1,000 mL (0 mLs Intravenous Stopped 04/27/16 0639)  pantoprazole (PROTONIX) 80 mg in sodium chloride 0.9 % 100 mL IVPB (0 mg Intravenous Stopped 04/27/16 0620)     Initial Impression / Assessment and Plan / ED Course  I  have reviewed the triage vital signs and the nursing notes.  Pertinent labs & imaging results that were available during my care of the patient were reviewed by me and considered in my medical decision making (see chart for details).  Clinical Course   Pt with dark stools, multiple BM's today that were black, has mild RLQ crampy abdominal pain, lightheadedness, SOB and weakness today, then just PTA had a syncopal episode while on the commode, is pale, cold and was diaphoretic.  Pt presents to the ER with normal HR, BP soft ~ SBP 90's, normally runs 130-140.  Hemoccult + +Upper GI bleed Type and screen ordered, protonix, two large bore IV's and 2L IVF, basic labs and acute chest and abd films, pt given protonix 80 + drip  Case discussed with Dr. Elesa Massed who has seen and evaluated the pt.  Will admit for further work up and treatment. Pt goes to [place] for GI, annual colonoscopy, no prior findings  Workup significant for elevated BUN/creatinine, anemia with hemoglobin 8.8, last available labs for comparison 2014 hemoglobin of 13.4, pt hypotensive initially, improved with IVF.  Dr. Clyde Lundborg to admit to tele obs GI consulted  Final Clinical Impressions(s) / ED Diagnoses   Final diagnoses:  Acute GI bleeding   New Prescriptions New Prescriptions   No medications on file     Danelle Berry, PA-C 04/27/16 0703

## 2016-04-27 NOTE — Anesthesia Postprocedure Evaluation (Signed)
Anesthesia Post Note  Patient: Sean Strong  Procedure(s) Performed: Procedure(s) (LRB): ESOPHAGOGASTRODUODENOSCOPY (EGD) WITH PROPOFOL (N/A)  Patient location during evaluation: PACU Anesthesia Type: MAC Level of consciousness: awake and alert Pain management: pain level controlled Vital Signs Assessment: post-procedure vital signs reviewed and stable Respiratory status: spontaneous breathing, nonlabored ventilation, respiratory function stable and patient connected to nasal cannula oxygen Cardiovascular status: stable and blood pressure returned to baseline Anesthetic complications: no    Last Vitals:  Vitals:   04/27/16 1016 04/27/16 1102  BP: 112/66 (!) 102/58  Pulse: 90 92  Resp: 13 15  Temp: 36.8 C 36.5 C    Last Pain:  Vitals:   04/27/16 1102  TempSrc: Oral  PainSc:                  Rocko Fesperman,W. EDMOND

## 2016-04-27 NOTE — Anesthesia Preprocedure Evaluation (Addendum)
Anesthesia Evaluation  Patient identified by MRN, date of birth, ID band Patient awake    Reviewed: Allergy & Precautions, H&P , NPO status , Patient's Chart, lab work & pertinent test results, reviewed documented beta blocker date and time   Airway Mallampati: II  TM Distance: >3 FB Neck ROM: Full    Dental no notable dental hx. (+) Teeth Intact, Dental Advisory Given   Pulmonary neg pulmonary ROS, former smoker,    Pulmonary exam normal breath sounds clear to auscultation       Cardiovascular hypertension, Pt. on medications and Pt. on home beta blockers + CAD and + Cardiac Stents   Rhythm:Regular Rate:Normal     Neuro/Psych Anxiety Depression negative neurological ROS     GI/Hepatic negative GI ROS, Neg liver ROS,   Endo/Other  negative endocrine ROS  Renal/GU negative Renal ROS  negative genitourinary   Musculoskeletal   Abdominal   Peds  Hematology negative hematology ROS (+)   Anesthesia Other Findings   Reproductive/Obstetrics negative OB ROS                            Anesthesia Physical Anesthesia Plan  ASA: III  Anesthesia Plan: MAC   Post-op Pain Management:    Induction: Intravenous  Airway Management Planned: Nasal Cannula  Additional Equipment:   Intra-op Plan:   Post-operative Plan:   Informed Consent: I have reviewed the patients History and Physical, chart, labs and discussed the procedure including the risks, benefits and alternatives for the proposed anesthesia with the patient or authorized representative who has indicated his/her understanding and acceptance.   Dental advisory given  Plan Discussed with: CRNA  Anesthesia Plan Comments:         Anesthesia Quick Evaluation

## 2016-04-28 ENCOUNTER — Encounter (HOSPITAL_COMMUNITY): Payer: Self-pay | Admitting: Gastroenterology

## 2016-04-28 DIAGNOSIS — Z955 Presence of coronary angioplasty implant and graft: Secondary | ICD-10-CM

## 2016-04-28 DIAGNOSIS — I2510 Atherosclerotic heart disease of native coronary artery without angina pectoris: Secondary | ICD-10-CM

## 2016-04-28 DIAGNOSIS — K625 Hemorrhage of anus and rectum: Secondary | ICD-10-CM | POA: Diagnosis not present

## 2016-04-28 DIAGNOSIS — I10 Essential (primary) hypertension: Secondary | ICD-10-CM

## 2016-04-28 DIAGNOSIS — K922 Gastrointestinal hemorrhage, unspecified: Secondary | ICD-10-CM | POA: Diagnosis not present

## 2016-04-28 DIAGNOSIS — R55 Syncope and collapse: Secondary | ICD-10-CM | POA: Diagnosis not present

## 2016-04-28 DIAGNOSIS — K260 Acute duodenal ulcer with hemorrhage: Secondary | ICD-10-CM | POA: Diagnosis not present

## 2016-04-28 LAB — BASIC METABOLIC PANEL
ANION GAP: 5 (ref 5–15)
BUN: 17 mg/dL (ref 6–20)
CHLORIDE: 114 mmol/L — AB (ref 101–111)
CO2: 20 mmol/L — AB (ref 22–32)
Calcium: 8.1 mg/dL — ABNORMAL LOW (ref 8.9–10.3)
Creatinine, Ser: 1.12 mg/dL (ref 0.61–1.24)
GFR calc Af Amer: >60 mL/min (ref >60)
GFR calc non Af Amer: >60 mL/min (ref >60)
GLUCOSE: 128 mg/dL — AB (ref 65–99)
POTASSIUM: 3.8 mmol/L (ref 3.5–5.1)
Sodium: 139 mmol/L (ref 135–145)

## 2016-04-28 LAB — CBC WITH DIFFERENTIAL/PLATELET
Basophils Absolute: 0 10*3/uL (ref 0.0–0.1)
Basophils Relative: 0 %
Eosinophils Absolute: 0.3 10*3/uL (ref 0.0–0.7)
Eosinophils Relative: 2 %
HEMATOCRIT: 28.1 % — AB (ref 39.0–52.0)
Hemoglobin: 9.2 g/dL — ABNORMAL LOW (ref 13.0–17.0)
LYMPHS ABS: 4 10*3/uL (ref 0.7–4.0)
LYMPHS PCT: 39 %
MCH: 25.5 pg — ABNORMAL LOW (ref 26.0–34.0)
MCHC: 32.7 g/dL (ref 30.0–36.0)
MCV: 77.8 fL — AB (ref 78.0–100.0)
MONO ABS: 0.5 10*3/uL (ref 0.1–1.0)
MONOS PCT: 5 %
NEUTROS ABS: 5.5 10*3/uL (ref 1.7–7.7)
Neutrophils Relative %: 53 %
RBC: 3.61 MIL/uL — ABNORMAL LOW (ref 4.22–5.81)
RDW: 16.2 % — AB (ref 11.5–15.5)
WBC: 10.2 10*3/uL (ref 4.0–10.5)

## 2016-04-28 LAB — GLUCOSE, CAPILLARY
GLUCOSE-CAPILLARY: 148 mg/dL — AB (ref 65–99)
Glucose-Capillary: 127 mg/dL — ABNORMAL HIGH (ref 65–99)
Glucose-Capillary: 93 mg/dL (ref 65–99)
Glucose-Capillary: 101 mg/dL — ABNORMAL HIGH (ref 65–99)
Glucose-Capillary: 110 mg/dL — ABNORMAL HIGH (ref 65–99)

## 2016-04-28 MED ORDER — CARVEDILOL 6.25 MG PO TABS
6.2500 mg | ORAL_TABLET | Freq: Two times a day (BID) | ORAL | Status: DC
  Administered 2016-04-28 – 2016-05-01 (×6): 6.25 mg via ORAL
  Filled 2016-04-28 (×6): qty 1

## 2016-04-28 MED ORDER — ASPIRIN EC 81 MG PO TBEC
81.0000 mg | DELAYED_RELEASE_TABLET | Freq: Every day | ORAL | Status: DC
  Administered 2016-04-28 – 2016-05-01 (×4): 81 mg via ORAL
  Filled 2016-04-28 (×4): qty 1

## 2016-04-28 NOTE — Progress Notes (Signed)
[facility]  PROGRESS NOTE  Sean Strong ZOX:[id number] DOB: 06-Mar-1966 DOA: 04/27/2016 PCP: No PCP Per Patient    Brief HPI:   50 y.o. male with medical history significant of  PTSD/depression/anxiety, HLD, CAD presenting w/ several day history of passing dark stools. On ASA and plavix. Associated w/ crampy abd pain, dizziness and single syncopal episode. Patient reports sitting on the toilet to have a bowel movement when he's having passed out. Denies any presyncopal symptoms such as chest pain, palpitations, shortness of breath, vertigo, lightheadedness. Denies hitting his head. Wife witnesses episode and states he was unconscious for very short period of time. Denies any limb shaking, loss of bowel or bladder function, or tongue biting. No postictal state.    Assessment/Plan:    1. Duodenal ulcer-status post hemorrhage, EGD showed duodenal ulcer, nonbleeding. CLO test is positive patient currently on triple therapy with amoxicillin, Biaxin, Protonix. Plavix currently on hold. 2. Anemia- hemoglobin improved to 9.2 after 2 units PRBC. Follow CBC in a.m. 3. Syncope- resolved likely combination of orthostatic hypotension, severe anemia from GI bleed. Echocardiogram is pending. 4. CAD status post stent placement in LAD- discussed with patient's cardiologist Dr. Jacinto Halim, recommend stool to continue with aspirin 81 mg daily and discontinue Plavix at this time. Has patient was placed on Plavix in 2014. 5. Hypertension- blood pressure stable, restart Coreg 6.25 mg twice a day   DVT prophylaxis: SCDs Code Status: Full code Family Communication: No family at bedside  Disposition Plan: Home in next 24 hours   Consultants:  GI  Procedures:  Upper GI endoscopy  Antibiotics:   Amoxicillin  Clarithromycin   Subjective   Patient seen and examined, takes aspirin and Plavix for CAD status post stent. Has been on Plavix since 2014  Objective    Objective: Vitals:   04/27/16  2107 04/27/16 2305 04/28/16 0408 04/28/16 0944  BP:  (!) 114/54 118/70 120/67  Pulse:  94 88 86  Resp:  17 18 18   Temp:  98.1 F (36.7 C) 98.5 F (36.9 C) 98.7 F (37.1 C)  TempSrc:  Oral Oral Oral  SpO2:  99% 100% 100%  Weight: 99.8 kg (220 lb)     Height:        Intake/Output Summary (Last 24 hours) at 04/28/16 1600 Last data filed at 04/28/16 0415  Gross per 24 hour  Intake          1254.75 ml  Output              450 ml  Net           804.75 ml   Filed Weights   04/27/16 0900 04/27/16 2107  Weight: 99.5 kg (219 lb 5.7 oz) 99.8 kg (220 lb)    Examination:  General exam: Appears calm and comfortable  Respiratory system: Clear to auscultation. Respiratory effort normal. Cardiovascular system: S1 & S2 heard, RRR. No JVD, murmurs, rubs, gallops or clicks. No pedal edema. Gastrointestinal system: Abdomen is nondistended, soft and nontender. No organomegaly or masses felt. Normal bowel sounds heard. Central nervous system: Alert and oriented. No focal neurological deficits. Extremities: Symmetric 5 x 5 power. Skin: No rashes, lesions or ulcers Psychiatry: Judgement and insight appear normal. Mood & affect appropriate.    Data Reviewed: I have personally reviewed following labs and imaging studies Basic Metabolic Panel:  Recent Labs Lab 04/27/16 0530 04/27/16 0538 04/28/16 0455  NA 139 141 139  K 3.7 3.7 3.8  CL 115* 111 114*  CO2  17*  --  20*  GLUCOSE 152* 151* 128*  BUN 42* 40* 17  CREATININE 1.30* 1.20 1.12  CALCIUM 8.4*  --  8.1*   Liver Function Tests: No results for input(s): AST, ALT, ALKPHOS, BILITOT, PROT, ALBUMIN in the last 168 hours. No results for input(s): LIPASE, AMYLASE in the last 168 hours. No results for input(s): AMMONIA in the last 168 hours. CBC:  Recent Labs Lab 04/27/16 0530 04/27/16 0538 04/27/16 1528 04/28/16 0453  WBC 10.5  --  8.0 10.2  NEUTROABS 6.1  --   --  5.5  HGB 8.8* 9.2* 6.9* 9.2*  HCT 26.9* 27.0* 20.3* 28.1*  MCV  74.1*  --  73.0* 77.8*  PLT 160  --  125* REPEATED TO VERIFY   Cardiac Enzymes:  Recent Labs Lab 04/27/16 1528  TROPONINI <0.03   BNP (last 3 results) No results for input(s): BNP in the last 8760 hours.  ProBNP (last 3 results) No results for input(s): PROBNP in the last 8760 hours.  CBG:  Recent Labs Lab 04/28/16 0119 04/28/16 0707 04/28/16 0756 04/28/16 1201  GLUCAP 127* 93 101* 110*    No results found for this or any previous visit (from the past 240 hour(s)).   Studies: No results found.  Scheduled Meds: . amoxicillin  1,000 mg Oral Q12H  . aspirin EC  81 mg Oral Daily  . clarithromycin  500 mg Oral Q12H  . pantoprazole  40 mg Oral BID AC  . rosuvastatin  5 mg Oral Daily  . sertraline  200 mg Oral Daily  . sodium chloride flush  3 mL Intravenous Q12H  . sodium chloride flush  3 mL Intravenous Q12H  . traZODone  200 mg Oral QHS   Continuous Infusions:      Time spent: 25 min    [facility],Nyx Keady S  [facility] Pager [phone]. If 7PM-7AM, please contact night-coverage at [email or web address], Office  [phone]  password [email or web address] 04/28/2016, 4:00 PM  LOS: 0 days

## 2016-04-28 NOTE — Progress Notes (Signed)
[facility] Progress Note  Subjective: The patient feels much better, still had a dark stool yesterday evening, tolerating diet  Objective: Vital signs in last 24 hours: Temp:  [97.7 F (36.5 C)-98.7 F (37.1 C)] 98.5 F (36.9 C) (09/06 0408) Pulse Rate:  [79-98] 88 (09/06 0408) Resp:  [13-20] 18 (09/06 0408) BP: (91-143)/(54-73) 118/70 (09/06 0408) SpO2:  [99 %-100 %] 100 % (09/06 0408) Weight:  [99.5 kg (219 lb 5.7 oz)-99.8 kg (220 lb)] 99.8 kg (220 lb) (09/05 2107) Weight change:    PE: Unchanged  Lab Results: Results for orders placed or performed during the hospital encounter of 04/27/16 (from the past 24 hour(s))  Clotest (H. pylori), biopsy     Status: Abnormal   Collection Time: 04/27/16 11:10 AM  Result Value Ref Range   Helicobacter screen UREASE POSITIVE (A) UREASE NEGATIVE  CBC     Status: Abnormal   Collection Time: 04/27/16  3:28 PM  Result Value Ref Range   WBC 8.0 4.0 - 10.5 K/uL   RBC 2.78 (L) 4.22 - 5.81 MIL/uL   Hemoglobin 6.9 (LL) 13.0 - 17.0 g/dL   HCT 16.1 (L) 09.6 - 04.5 %   MCV 73.0 (L) 78.0 - 100.0 fL   MCH 24.8 (L) 26.0 - 34.0 pg   MCHC 34.0 30.0 - 36.0 g/dL   RDW 40.9 81.1 - 91.4 %   Platelets 125 (L) 150 - 400 K/uL  Troponin I (q 6hr x 3)     Status: None   Collection Time: 04/27/16  3:28 PM  Result Value Ref Range   Troponin I <0.03 <0.03 ng/mL  Prepare RBC     Status: None   Collection Time: 04/27/16  3:56 PM  Result Value Ref Range   Order Confirmation ORDER PROCESSED BY BLOOD BANK   Glucose, capillary     Status: Abnormal   Collection Time: 04/28/16  1:19 AM  Result Value Ref Range   Glucose-Capillary 127 (H) 65 - 99 mg/dL  CBC with Differential/Platelet     Status: Abnormal   Collection Time: 04/28/16  4:53 AM  Result Value Ref Range   WBC 10.2 4.0 - 10.5 K/uL   RBC 3.61 (L) 4.22 - 5.81 MIL/uL   Hemoglobin 9.2 (L) 13.0 - 17.0 g/dL   HCT 78.2 (L) 95.6 - 21.3 %   MCV 77.8 (L) 78.0 - 100.0 fL   MCH 25.5 (L) 26.0 -  34.0 pg   MCHC 32.7 30.0 - 36.0 g/dL   RDW 08.6 (H) 57.8 - 46.9 %   Platelets REPEATED TO VERIFY 150 - 400 K/uL   Neutrophils Relative % 53 %   Neutro Abs 5.5 1.7 - 7.7 K/uL   Lymphocytes Relative 39 %   Lymphs Abs 4.0 0.7 - 4.0 K/uL   Monocytes Relative 5 %   Monocytes Absolute 0.5 0.1 - 1.0 K/uL   Eosinophils Relative 2 %   Eosinophils Absolute 0.3 0.0 - 0.7 K/uL   Basophils Relative 0 %   Basophils Absolute 0.0 0.0 - 0.1 K/uL  Basic metabolic panel     Status: Abnormal   Collection Time: 04/28/16  4:55 AM  Result Value Ref Range   Sodium 139 135 - 145 mmol/L   Potassium 3.8 3.5 - 5.1 mmol/L   Chloride 114 (H) 101 - 111 mmol/L   CO2 20 (L) 22 - 32 mmol/L   Glucose, Bld 128 (H) 65 - 99 mg/dL   BUN 17 6 - 20 mg/dL   Creatinine, Ser  1.12 0.61 - 1.24 mg/dL   Calcium 8.1 (L) 8.9 - 10.3 mg/dL   GFR calc non Af Amer >60 >60 mL/min   GFR calc Af Amer >60 >60 mL/min   Anion gap 5 5 - 15  Glucose, capillary     Status: None   Collection Time: 04/28/16  7:07 AM  Result Value Ref Range   Glucose-Capillary 93 65 - 99 mg/dL    Studies/Results: No results found.    Assessment: Duodenal ulcer status post hemorrhage, CLOtest positive  Plan: Triple therapy initiated Continue to hold Plavix for 7 days total. Discharge tomorrow if stable    Damir Leung C 04/28/2016, 7:56 AM  Pager [phone] If no answer or after 5 PM call [phone]

## 2016-04-29 ENCOUNTER — Other Ambulatory Visit (HOSPITAL_COMMUNITY): Payer: Federal, State, Local not specified - PPO

## 2016-04-29 DIAGNOSIS — N179 Acute kidney failure, unspecified: Secondary | ICD-10-CM | POA: Diagnosis present

## 2016-04-29 DIAGNOSIS — K625 Hemorrhage of anus and rectum: Secondary | ICD-10-CM | POA: Diagnosis not present

## 2016-04-29 DIAGNOSIS — I951 Orthostatic hypotension: Secondary | ICD-10-CM | POA: Diagnosis present

## 2016-04-29 DIAGNOSIS — Z79899 Other long term (current) drug therapy: Secondary | ICD-10-CM | POA: Diagnosis not present

## 2016-04-29 DIAGNOSIS — F329 Major depressive disorder, single episode, unspecified: Secondary | ICD-10-CM | POA: Diagnosis present

## 2016-04-29 DIAGNOSIS — Z87891 Personal history of nicotine dependence: Secondary | ICD-10-CM | POA: Diagnosis not present

## 2016-04-29 DIAGNOSIS — K264 Chronic or unspecified duodenal ulcer with hemorrhage: Secondary | ICD-10-CM | POA: Diagnosis present

## 2016-04-29 DIAGNOSIS — I10 Essential (primary) hypertension: Secondary | ICD-10-CM | POA: Diagnosis not present

## 2016-04-29 DIAGNOSIS — R55 Syncope and collapse: Secondary | ICD-10-CM

## 2016-04-29 DIAGNOSIS — B9681 Helicobacter pylori [H. pylori] as the cause of diseases classified elsewhere: Secondary | ICD-10-CM | POA: Diagnosis present

## 2016-04-29 DIAGNOSIS — D500 Iron deficiency anemia secondary to blood loss (chronic): Secondary | ICD-10-CM | POA: Diagnosis present

## 2016-04-29 DIAGNOSIS — F4310 Post-traumatic stress disorder, unspecified: Secondary | ICD-10-CM | POA: Diagnosis present

## 2016-04-29 DIAGNOSIS — F419 Anxiety disorder, unspecified: Secondary | ICD-10-CM | POA: Diagnosis present

## 2016-04-29 DIAGNOSIS — E785 Hyperlipidemia, unspecified: Secondary | ICD-10-CM | POA: Diagnosis present

## 2016-04-29 DIAGNOSIS — K260 Acute duodenal ulcer with hemorrhage: Secondary | ICD-10-CM | POA: Diagnosis not present

## 2016-04-29 DIAGNOSIS — K922 Gastrointestinal hemorrhage, unspecified: Secondary | ICD-10-CM | POA: Diagnosis not present

## 2016-04-29 DIAGNOSIS — I2510 Atherosclerotic heart disease of native coronary artery without angina pectoris: Secondary | ICD-10-CM | POA: Diagnosis not present

## 2016-04-29 DIAGNOSIS — Z7982 Long term (current) use of aspirin: Secondary | ICD-10-CM | POA: Diagnosis not present

## 2016-04-29 DIAGNOSIS — R739 Hyperglycemia, unspecified: Secondary | ICD-10-CM | POA: Diagnosis present

## 2016-04-29 DIAGNOSIS — Z7902 Long term (current) use of antithrombotics/antiplatelets: Secondary | ICD-10-CM | POA: Diagnosis not present

## 2016-04-29 DIAGNOSIS — Z955 Presence of coronary angioplasty implant and graft: Secondary | ICD-10-CM | POA: Diagnosis not present

## 2016-04-29 LAB — BASIC METABOLIC PANEL WITH GFR
Anion gap: 6 (ref 5–15)
BUN: 9 mg/dL (ref 6–20)
CO2: 22 mmol/L (ref 22–32)
Calcium: 8.1 mg/dL — ABNORMAL LOW (ref 8.9–10.3)
Chloride: 111 mmol/L (ref 101–111)
Creatinine, Ser: 1.1 mg/dL (ref 0.61–1.24)
GFR calc Af Amer: >60 mL/min
GFR calc non Af Amer: >60 mL/min
Glucose, Bld: 106 mg/dL — ABNORMAL HIGH (ref 65–99)
Potassium: 3.8 mmol/L (ref 3.5–5.1)
Sodium: 139 mmol/L (ref 135–145)

## 2016-04-29 LAB — GLUCOSE, CAPILLARY
GLUCOSE-CAPILLARY: 94 mg/dL (ref 65–99)
GLUCOSE-CAPILLARY: 102 mg/dL — AB (ref 65–99)
Glucose-Capillary: 107 mg/dL — ABNORMAL HIGH (ref 65–99)
Glucose-Capillary: 110 mg/dL — ABNORMAL HIGH (ref 65–99)

## 2016-04-29 LAB — CBC
HCT: 23.3 % — ABNORMAL LOW (ref 39.0–52.0)
HEMOGLOBIN: 7.8 g/dL — AB (ref 13.0–17.0)
MCH: 25.7 pg — ABNORMAL LOW (ref 26.0–34.0)
MCHC: 33.5 g/dL (ref 30.0–36.0)
MCV: 76.6 fL — ABNORMAL LOW (ref 78.0–100.0)
PLATELETS: 123 10*3/uL — AB (ref 150–400)
RBC: 3.04 MIL/uL — AB (ref 4.22–5.81)
RDW: 16.3 % — ABNORMAL HIGH (ref 11.5–15.5)
WBC: 7.5 10*3/uL (ref 4.0–10.5)

## 2016-04-29 LAB — PREPARE RBC (CROSSMATCH)

## 2016-04-29 MED ORDER — DEXTROSE 50 % IV SOLN
INTRAVENOUS | Status: AC
  Administered 2016-04-29: 04:00:00
  Filled 2016-04-29: qty 50

## 2016-04-29 MED ORDER — SODIUM CHLORIDE 0.9 % IV SOLN
Freq: Once | INTRAVENOUS | Status: AC
  Administered 2016-04-29: 11:00:00 via INTRAVENOUS

## 2016-04-29 NOTE — Progress Notes (Signed)
[facility] Progress Note  Subjective: The patient states he had bright red blood per rectum yesterday afternoon moderate amount, no more black stools.  Objective: Vital signs in last 24 hours: Temp:  [98.4 F (36.9 C)-98.7 F (37.1 C)] 98.4 F (36.9 C) (09/07 16100637) Pulse Rate:  [79-92] 80 (09/07 0637) Resp:  [18-21] 21 (09/07 0637) BP: (104-139)/(50-73) 131/67 (09/07 0637) SpO2:  [98 %-100 %] 98 % (09/07 0637) Weight change:    PE: Unchanged. Perianal exam reveals no obvious external hemorrhoids or blood  Lab Results: Results for orders placed or performed during the hospital encounter of 04/27/16 (from the past 24 hour(s))  Glucose, capillary     Status: Abnormal   Collection Time: 04/28/16 12:01 PM  Result Value Ref Range   Glucose-Capillary 110 (H) 65 - 99 mg/dL   Comment 1 Notify RN    Comment 2 Document in Chart   Glucose, capillary     Status: Abnormal   Collection Time: 04/28/16  4:23 PM  Result Value Ref Range   Glucose-Capillary 148 (H) 65 - 99 mg/dL  CBC     Status: Abnormal   Collection Time: 04/29/16  4:22 AM  Result Value Ref Range   WBC 7.5 4.0 - 10.5 K/uL   RBC 3.04 (L) 4.22 - 5.81 MIL/uL   Hemoglobin 7.8 (L) 13.0 - 17.0 g/dL   HCT 96.023.3 (L) 45.439.0 - 09.852.0 %   MCV 76.6 (L) 78.0 - 100.0 fL   MCH 25.7 (L) 26.0 - 34.0 pg   MCHC 33.5 30.0 - 36.0 g/dL   RDW 11.916.3 (H) 14.711.5 - 82.915.5 %   Platelets 123 (L) 150 - 400 K/uL  Basic metabolic panel     Status: Abnormal   Collection Time: 04/29/16  4:22 AM  Result Value Ref Range   Sodium 139 135 - 145 mmol/L   Potassium 3.8 3.5 - 5.1 mmol/L   Chloride 111 101 - 111 mmol/L   CO2 22 22 - 32 mmol/L   Glucose, Bld 106 (H) 65 - 99 mg/dL   BUN 9 6 - 20 mg/dL   Creatinine, Ser 5.621.10 0.61 - 1.24 mg/dL   Calcium 8.1 (L) 8.9 - 10.3 mg/dL   GFR calc non Af Amer >60 >60 mL/min   GFR calc Af Amer >60 >60 mL/min   Anion gap 6 5 - 15  Glucose, capillary     Status: Abnormal   Collection Time: 04/29/16  6:35 AM   Result Value Ref Range   Glucose-Capillary 107 (H) 65 - 99 mg/dL    Studies/Results: No results found.    Assessment: GI bleeding likely from duodenal ulcer with new bright red blood per rectum of uncertain quantity. Overall I doubt this represents recurrent duodenal ulcer bleeding. Patient had a colonoscopy a year ago to be a with uncertain findings. Current rectal bleeding could represent hemorrhoidal bleeding or true lower GI bleed  Plan: Continue triple therapy Transfuse 2 units of packed red blood cells Monitor stools and hemoglobin Nothing by mouth after midnight for possible repeat endoscopy    Selia Wareing C 04/29/2016, 8:49 AM  Pager [phone] If no answer or after 5 PM call [phone]

## 2016-04-29 NOTE — Progress Notes (Signed)
[facility]  PROGRESS NOTE  Sean Strong WUJ:[id number] DOB: [birth year] DOA: 04/27/2016 PCP: No PCP Per Patient    Brief HPI:   50 y.o. male with medical history significant of  PTSD/depression/anxiety, HLD, CAD presenting w/ several day history of passing dark stools. On ASA and plavix. Associated w/ crampy abd pain, dizziness and single syncopal episode. Patient reports sitting on the toilet to have a bowel movement when he's having passed out. Denies any presyncopal symptoms such as chest pain, palpitations, shortness of breath, vertigo, lightheadedness. Denies hitting his head. Wife witnesses episode and states he was unconscious for very short period of time. Denies any limb shaking, loss of bowel or bladder function, or tongue biting. No postictal state.    Assessment/Plan:    1. Duodenal ulcer-status post hemorrhage, EGD showed duodenal ulcer, nonbleeding. CLO test is positive patient currently on triple therapy with amoxicillin, Biaxin, Protonix. Plavix currently on hold.  2. ? Lower GI bleed- GI following, has been kept NPO after MN for possible EGD in am, 3. Anemia- hemoglobin 7.8 today, will be transfused 2 units PRBC. Follow CBC in a.m. 4. Syncope- resolved likely combination of orthostatic hypotension, severe anemia from GI bleed. Echocardiogram is pending. 5. CAD status post stent placement in LAD- discussed with patient's cardiologist Dr. Jacinto Halim, recommend stool to continue with aspirin 81 mg daily and discontinue Plavix at this time. Has patient was placed on Plavix in 2014. 6. Hypertension- blood pressure stable, restart Coreg 6.25 mg twice a day   DVT prophylaxis: SCDs Code Status: Full code Family Communication: No family at bedside  Disposition Plan: Home in next 24 hours   Consultants:  GI  Procedures:  Upper GI endoscopy  Antibiotics:   Amoxicillin  Clarithromycin   Subjective   Patient seen and examined, takes aspirin and Plavix for CAD  status post stent. Has been on Plavix since 2014. He has bloody BM yesterday  Objective    Objective: Vitals:   04/29/16 0637 04/29/16 0900 04/29/16 1057 04/29/16 1126  BP: 131/67 (!) 105/50 125/66 115/66  Pulse: 80     Resp: (!) 21 20 20 20   Temp: 98.4 F (36.9 C) 98.6 F (37 C) 98.7 F (37.1 C) 98.5 F (36.9 C)  TempSrc:  Oral Oral Oral  SpO2: 98% 98% 96% 97%  Weight:      Height:        Intake/Output Summary (Last 24 hours) at 04/29/16 1220 Last data filed at 04/29/16 1147  Gross per 24 hour  Intake              740 ml  Output              603 ml  Net              137 ml   Filed Weights   04/27/16 0900 04/27/16 2107  Weight: 99.5 kg (219 lb 5.7 oz) 99.8 kg (220 lb)    Examination:  General exam: Appears calm and comfortable  Respiratory system: Clear to auscultation. Respiratory effort normal. Cardiovascular system: S1 & S2 heard, RRR. No JVD, murmurs, rubs, gallops or clicks. No pedal edema. Gastrointestinal system: Abdomen is nondistended, soft and nontender. No organomegaly or masses felt. Normal bowel sounds heard. Central nervous system: Alert and oriented. No focal neurological deficits. Extremities: Symmetric 5 x 5 power. Skin: No rashes, lesions or ulcers Psychiatry: Judgement and insight appear normal. Mood & affect appropriate.    Data Reviewed: I have personally reviewed following labs  and imaging studies Basic Metabolic Panel:  Recent Labs Lab 04/27/16 0530 04/27/16 0538 04/28/16 0455 04/29/16 0422  NA 139 141 139 139  K 3.7 3.7 3.8 3.8  CL 115* 111 114* 111  CO2 17*  --  20* 22  GLUCOSE 152* 151* 128* 106*  BUN 42* 40* 17 9  CREATININE 1.30* 1.20 1.12 1.10  CALCIUM 8.4*  --  8.1* 8.1*   Liver Function Tests: No results for input(s): AST, ALT, ALKPHOS, BILITOT, PROT, ALBUMIN in the last 168 hours. No results for input(s): LIPASE, AMYLASE in the last 168 hours. No results for input(s): AMMONIA in the last 168 hours. CBC:  Recent  Labs Lab 04/27/16 0530 04/27/16 0538 04/27/16 1528 04/28/16 0453 04/29/16 0422  WBC 10.5  --  8.0 10.2 7.5  NEUTROABS 6.1  --   --  5.5  --   HGB 8.8* 9.2* 6.9* 9.2* 7.8*  HCT 26.9* 27.0* 20.3* 28.1* 23.3*  MCV 74.1*  --  73.0* 77.8* 76.6*  PLT 160  --  125* REPEATED TO VERIFY 123*   Cardiac Enzymes:  Recent Labs Lab 04/27/16 1528  TROPONINI <0.03   BNP (last 3 results) No results for input(s): BNP in the last 8760 hours.  ProBNP (last 3 results) No results for input(s): PROBNP in the last 8760 hours.  CBG:  Recent Labs Lab 04/28/16 1201 04/28/16 1623 04/28/16 2357 04/29/16 0635 04/29/16 1130  GLUCAP 110* 148* 110* 107* 94    No results found for this or any previous visit (from the past 240 hour(s)).   Studies: No results found.  Scheduled Meds: . amoxicillin  1,000 mg Oral Q12H  . aspirin EC  81 mg Oral Daily  . carvedilol  6.25 mg Oral BID WC  . clarithromycin  500 mg Oral Q12H  . pantoprazole  40 mg Oral BID AC  . rosuvastatin  5 mg Oral Daily  . sertraline  200 mg Oral Daily  . sodium chloride flush  3 mL Intravenous Q12H  . sodium chloride flush  3 mL Intravenous Q12H  . traZODone  200 mg Oral QHS   Continuous Infusions:      Time spent: 25 min    [facility],Mina Carlisi S  [facility] Pager [phone]. If 7PM-7AM, please contact night-coverage at [email or web address], Office  [phone]  password [email or web address] 04/29/2016, 12:20 PM  LOS: 0 days

## 2016-04-30 ENCOUNTER — Inpatient Hospital Stay (HOSPITAL_COMMUNITY): Payer: Federal, State, Local not specified - PPO

## 2016-04-30 ENCOUNTER — Other Ambulatory Visit (HOSPITAL_COMMUNITY): Payer: Federal, State, Local not specified - PPO

## 2016-04-30 DIAGNOSIS — R55 Syncope and collapse: Secondary | ICD-10-CM

## 2016-04-30 LAB — TYPE AND SCREEN
ABO/RH(D): O POS
Antibody Screen: NEGATIVE
UNIT DIVISION: 0
UNIT DIVISION: 0
Unit division: 0
Unit division: 0

## 2016-04-30 LAB — ECHOCARDIOGRAM COMPLETE
HEIGHTINCHES: 70 in
Weight: 3520 oz

## 2016-04-30 LAB — BASIC METABOLIC PANEL
ANION GAP: 6 (ref 5–15)
BUN: 11 mg/dL (ref 6–20)
CALCIUM: 8.1 mg/dL — AB (ref 8.9–10.3)
CO2: 23 mmol/L (ref 22–32)
Chloride: 110 mmol/L (ref 101–111)
Creatinine, Ser: 1.18 mg/dL (ref 0.61–1.24)
GLUCOSE: 117 mg/dL — AB (ref 65–99)
Potassium: 3.7 mmol/L (ref 3.5–5.1)
SODIUM: 139 mmol/L (ref 135–145)

## 2016-04-30 LAB — CBC
HCT: 29.1 % — ABNORMAL LOW (ref 39.0–52.0)
Hemoglobin: 9.4 g/dL — ABNORMAL LOW (ref 13.0–17.0)
MCH: 26 pg (ref 26.0–34.0)
MCHC: 32.3 g/dL (ref 30.0–36.0)
MCV: 80.4 fL (ref 78.0–100.0)
PLATELETS: 144 10*3/uL — AB (ref 150–400)
RBC: 3.62 MIL/uL — ABNORMAL LOW (ref 4.22–5.81)
RDW: 16.6 % — AB (ref 11.5–15.5)
WBC: 6.6 10*3/uL (ref 4.0–10.5)

## 2016-04-30 LAB — GLUCOSE, CAPILLARY
GLUCOSE-CAPILLARY: 107 mg/dL — AB (ref 65–99)
GLUCOSE-CAPILLARY: 110 mg/dL — AB (ref 65–99)
GLUCOSE-CAPILLARY: 169 mg/dL — AB (ref 65–99)
Glucose-Capillary: 100 mg/dL — ABNORMAL HIGH (ref 65–99)
Glucose-Capillary: 129 mg/dL — ABNORMAL HIGH (ref 65–99)

## 2016-04-30 NOTE — Progress Notes (Signed)
[facility] Progress Note  Subjective: Patient feeling slightly lightheaded otherwise okay. Had one stool this morning which she saved that is formed dark brown with some red blood surrounding it  Objective: Vital signs in last 24 hours: Temp:  [98.1 F (36.7 C)-98.7 F (37.1 C)] 98.1 F (36.7 C) (09/08 0557) Pulse Rate:  [67-78] 67 (09/08 0557) Resp:  [19-20] 19 (09/08 0557) BP: (115-134)/(66-81) 130/73 (09/08 0557) SpO2:  [96 %-99 %] 99 % (09/08 0557) Weight change:    PE: Unchanged  Lab Results: Results for orders placed or performed during the hospital encounter of 04/27/16 (from the past 24 hour(s))  Glucose, capillary     Status: None   Collection Time: 04/29/16 11:30 AM  Result Value Ref Range   Glucose-Capillary 94 65 - 99 mg/dL  Glucose, capillary     Status: Abnormal   Collection Time: 04/29/16  6:01 PM  Result Value Ref Range   Glucose-Capillary 102 (H) 65 - 99 mg/dL  Glucose, capillary     Status: Abnormal   Collection Time: 04/30/16 12:04 AM  Result Value Ref Range   Glucose-Capillary 110 (H) 65 - 99 mg/dL  CBC     Status: Abnormal   Collection Time: 04/30/16  4:18 AM  Result Value Ref Range   WBC 6.6 4.0 - 10.5 K/uL   RBC 3.62 (L) 4.22 - 5.81 MIL/uL   Hemoglobin 9.4 (L) 13.0 - 17.0 g/dL   HCT 16.129.1 (L) 09.639.0 - 04.552.0 %   MCV 80.4 78.0 - 100.0 fL   MCH 26.0 26.0 - 34.0 pg   MCHC 32.3 30.0 - 36.0 g/dL   RDW 40.916.6 (H) 81.111.5 - 91.415.5 %   Platelets 144 (L) 150 - 400 K/uL  Basic metabolic panel     Status: Abnormal   Collection Time: 04/30/16  4:18 AM  Result Value Ref Range   Sodium 139 135 - 145 mmol/L   Potassium 3.7 3.5 - 5.1 mmol/L   Chloride 110 101 - 111 mmol/L   CO2 23 22 - 32 mmol/L   Glucose, Bld 117 (H) 65 - 99 mg/dL   BUN 11 6 - 20 mg/dL   Creatinine, Ser 7.821.18 0.61 - 1.24 mg/dL   Calcium 8.1 (L) 8.9 - 10.3 mg/dL   GFR calc non Af Amer >60 >60 mL/min   GFR calc Af Amer >60 >60 mL/min   Anion gap 6 5 - 15  Glucose, capillary     Status:  Abnormal   Collection Time: 04/30/16  5:55 AM  Result Value Ref Range   Glucose-Capillary 107 (H) 65 - 99 mg/dL    Studies/Results: No results found.    Assessment: Duodenal ulcer with bleed, 3 days out from EGD with questionable recurrent bleeding although bright red blood would appear to be from a lower GI source, perhaps hemorrhoidal. [facility] in the last year showed only polyps.  Plan: Discussed discharge versus one more day of observation and have decided on the latter. Unable to do EGD today due to full schedule. We'll continue to monitor stools and hemoglobin, hold nothing by mouth after midnight and decide whether to discharge, vs repeat EGD plus minus flexible sigmoidoscopy.    Jameriah Trotti C 04/30/2016, 9:22 AM  Pager [phone] If no answer or after 5 PM call [phone]

## 2016-04-30 NOTE — Progress Notes (Signed)
[facility]  PROGRESS NOTE  Sean Strong:[id number] DOB: Dec 05, 1965 DOA: 04/27/2016 PCP: No PCP Per Patient    Brief HPI:   50 y.o. male with medical history significant of  PTSD/depression/anxiety, HLD, CAD presenting w/ several day history of passing dark stools. On ASA and plavix. Associated w/ crampy abd pain, dizziness and single syncopal episode. Patient reports sitting on the toilet to have a bowel movement when he's having passed out. Denies any presyncopal symptoms such as chest pain, palpitations, shortness of breath, vertigo, lightheadedness. Denies hitting his head. Wife witnesses episode and states he was unconscious for very short period of time. Denies any limb shaking, loss of bowel or bladder function, or tongue biting. No postictal state.    Assessment/Plan:    1. Duodenal ulcer-status post hemorrhage, EGD showed duodenal ulcer, nonbleeding. CLO test is positive patient currently on triple therapy with amoxicillin, Biaxin, Protonix. Plavix currently on hold.  2. ? Lower GI bleed- GI following, has been kept NPO after MN for possible EGD and or Flex sig in am. 3. Anemia- hemoglobin  today, will be transfused 2 units PRBC. Follow CBC in a.m. 4. Syncope- resolved likely combination of orthostatic hypotension, severe anemia from GI bleed. 5. CAD status post stent placement in LAD- discussed with patient's cardiologist Dr. Jacinto Halim, recommend stool to continue with aspirin 81 mg daily and discontinue Plavix at this time. Has patient was placed on Plavix in 2014. 6. Hypertension- blood pressure stable, restart Coreg 6.25 mg twice a day   DVT prophylaxis: SCDs Code Status: Full code Family Communication: No family at bedside  Disposition Plan: Home in next 24 hours   Consultants:  GI  Procedures:  Upper GI endoscopy  Antibiotics:   Amoxicillin  Clarithromycin   Subjective   Patient seen and examined, takes aspirin and Plavix for CAD status post stent.  Has been on Plavix since 2014. He has bloody BM yesterday.  Objective    Objective: Vitals:   04/29/16 1755 04/29/16 2059 04/30/16 0557 04/30/16 0900  BP: 130/74 133/81 130/73 136/78  Pulse:  78 67 71  Resp: 20 20 19 19   Temp: 98.4 F (36.9 C) 98.5 F (36.9 C) 98.1 F (36.7 C) 98.2 F (36.8 C)  TempSrc: Oral Oral Oral Oral  SpO2: 97% 97% 99% 99%  Weight:      Height:        Intake/Output Summary (Last 24 hours) at 04/30/16 1411 Last data filed at 04/30/16 1349  Gross per 24 hour  Intake             1390 ml  Output             1376 ml  Net               14 ml   Filed Weights   04/27/16 0900 04/27/16 2107  Weight: 99.5 kg (219 lb 5.7 oz) 99.8 kg (220 lb)    Examination:  General exam: Appears calm and comfortable  Respiratory system: Clear to auscultation. Respiratory effort normal. Cardiovascular system: S1 & S2 heard, RRR. No JVD, murmurs, rubs, gallops or clicks. No pedal edema. Gastrointestinal system: Abdomen is nondistended, soft and nontender. No organomegaly or masses felt. Normal bowel sounds heard. Central nervous system: Alert and oriented. No focal neurological deficits. Extremities: Symmetric 5 x 5 power. Skin: No rashes, lesions or ulcers Psychiatry: Judgement and insight appear normal. Mood & affect appropriate.    Data Reviewed: I have personally reviewed following labs and imaging  studies Basic Metabolic Panel:  Recent Labs Lab 04/27/16 0530 04/27/16 0538 04/28/16 0455 04/29/16 0422 04/30/16 0418  NA 139 141 139 139 139  K 3.7 3.7 3.8 3.8 3.7  CL 115* 111 114* 111 110  CO2 17*  --  20* 22 23  GLUCOSE 152* 151* 128* 106* 117*  BUN 42* 40* 17 9 11   CREATININE 1.30* 1.20 1.12 1.10 1.18  CALCIUM 8.4*  --  8.1* 8.1* 8.1*   Liver Function Tests: No results for input(s): AST, ALT, ALKPHOS, BILITOT, PROT, ALBUMIN in the last 168 hours. No results for input(s): LIPASE, AMYLASE in the last 168 hours. No results for input(s): AMMONIA in the  last 168 hours. CBC:  Recent Labs Lab 04/27/16 0530 04/27/16 0538 04/27/16 1528 04/28/16 0453 04/29/16 0422 04/30/16 0418  WBC 10.5  --  8.0 10.2 7.5 6.6  NEUTROABS 6.1  --   --  5.5  --   --   HGB 8.8* 9.2* 6.9* 9.2* 7.8* 9.4*  HCT 26.9* 27.0* 20.3* 28.1* 23.3* 29.1*  MCV 74.1*  --  73.0* 77.8* 76.6* 80.4  PLT 160  --  125* REPEATED TO VERIFY 123* 144*   Cardiac Enzymes:  Recent Labs Lab 04/27/16 1528  TROPONINI <0.03   BNP (last 3 results) No results for input(s): BNP in the last 8760 hours.  ProBNP (last 3 results) No results for input(s): PROBNP in the last 8760 hours.  CBG:  Recent Labs Lab 04/29/16 1130 04/29/16 1801 04/30/16 0004 04/30/16 0555 04/30/16 1147  GLUCAP 94 102* 110* 107* 169*    No results found for this or any previous visit (from the past 240 hour(s)).   Studies: No results found.  Scheduled Meds: . amoxicillin  1,000 mg Oral Q12H  . aspirin EC  81 mg Oral Daily  . carvedilol  6.25 mg Oral BID WC  . clarithromycin  500 mg Oral Q12H  . pantoprazole  40 mg Oral BID AC  . rosuvastatin  5 mg Oral Daily  . sertraline  200 mg Oral Daily  . sodium chloride flush  3 mL Intravenous Q12H  . sodium chloride flush  3 mL Intravenous Q12H  . traZODone  200 mg Oral QHS   Continuous Infusions:      Time spent: 25 min    [facility],Maegan Buller S  [facility] Pager [phone]. If 7PM-7AM, please contact night-coverage at [email or web address], Office  [phone]  password [email or web address] 04/30/2016, 2:11 PM  LOS: 1 day

## 2016-04-30 NOTE — Progress Notes (Signed)
Echocardiogram 2D Echocardiogram has been performed.  Marisue Humblelexis N Latia Mataya 04/30/2016, 4:32 PM

## 2016-05-01 LAB — GLUCOSE, CAPILLARY
GLUCOSE-CAPILLARY: 99 mg/dL (ref 65–99)
Glucose-Capillary: 111 mg/dL — ABNORMAL HIGH (ref 65–99)

## 2016-05-01 LAB — CBC
HCT: 31.7 % — ABNORMAL LOW (ref 39.0–52.0)
Hemoglobin: 10.3 g/dL — ABNORMAL LOW (ref 13.0–17.0)
MCH: 26 pg (ref 26.0–34.0)
MCHC: 32.5 g/dL (ref 30.0–36.0)
MCV: 80.1 fL (ref 78.0–100.0)
PLATELETS: 160 10*3/uL (ref 150–400)
RBC: 3.96 MIL/uL — AB (ref 4.22–5.81)
RDW: 16.6 % — ABNORMAL HIGH (ref 11.5–15.5)
WBC: 7.8 10*3/uL (ref 4.0–10.5)

## 2016-05-01 MED ORDER — AMOXICILLIN 500 MG PO CAPS: 1000.0000 mg | ORAL_CAPSULE | Freq: Two times a day (BID) | ORAL | 0 refills | Status: DC

## 2016-05-01 MED ORDER — CLARITHROMYCIN 500 MG PO TABS: 500.0000 mg | ORAL_TABLET | Freq: Two times a day (BID) | ORAL | 0 refills | Status: DC

## 2016-05-01 MED ORDER — PANTOPRAZOLE SODIUM 40 MG PO TBEC: 40.0000 mg | DELAYED_RELEASE_TABLET | Freq: Two times a day (BID) | ORAL | 0 refills | Status: AC

## 2016-05-01 NOTE — Progress Notes (Signed)
Reviewed discharge instructions and medications with patient; all questions answered. Assessment as per charted.

## 2016-05-01 NOTE — Discharge Summary (Signed)
Physician Discharge Summary  Sean AlarGilbert L Tandy ZOX:[id number] DOB: 03/23/1966 DOA: 04/27/2016  PCP: No PCP Per Patient  Admit date: 04/27/2016 Discharge date: 05/01/2016  Time spent: 25* minutes  Recommendations for Outpatient Follow-up:  1. Follow-up GI Dr. Madilyn FiremanHayes in 1 week   Discharge Diagnoses:  Active Problems:   PTSD (post-traumatic stress disorder)   Upper GI bleed   Syncope   H/O heart artery stent   CAD (coronary artery disease)   Essential hypertension   Hyperglycemia   Acute GI bleeding   Discharge Condition: Stable  Diet recommendation: Regular diet  Filed Weights   04/27/16 0900 04/27/16 2107  Weight: 99.5 kg (219 lb 5.7 oz) 99.8 kg (220 lb)    History of present illness:  50 y.o.malewith medical history significant of PTSD/depression/anxiety, HLD, CAD presenting w/ several day history of passing dark stools. On ASA and plavix. Associated w/ crampy abd pain, dizziness and single syncopal episode. Patient reports sitting on the toilet to have a bowel movement when he's having passed out. Denies any presyncopal symptoms such as chest pain, palpitations, shortness of breath, vertigo, lightheadedness. Denies hitting his head. Wife witnesses episode and states he was unconscious for very short period of time. Denies any limb shaking, loss of bowel or bladder function, or tongue biting. No postictal state.  Hospital Course:  1. Duodenal ulcer-status post hemorrhage, EGD showed duodenal ulcer, nonbleeding. CLO test is positive patient currently on triple therapy with amoxicillin, Biaxin, Protonix. We'll send 10 more days of prescriptions to pharmacy. 2. ? Lower GI bleed- patient had bright red blood per rectum in the hospital, was observed for 48 hours. Hemoglobin has remained stable 10.3. GI has recommended discharge and follow-up in 1 week. 3. Anemia- hemoglobin 10.3 after transfusion of 2 units PRBC . 4. Syncope- resolved likely combination of orthostatic hypotension,  severe anemia from GI bleed. Echocardiogram showed EF 55-60%. Normal wall motion. 5. CAD status post stent placement in LAD- discussed with patient's cardiologist Dr. Jacinto HalimGanji, recommend  to continue with aspirin 81 mg daily and discontinue Plavix at this time. patient was placed on Plavix in 2014. Discontinue Plavix at this time. 6. Hypertension- blood pressure stable, restart Coreg 6.25 mg twice a day  Procedures:  EGD  Consultations:  Gastroenterology  Discharge Exam: Vitals:   04/30/16 2141 05/01/16 0954  BP: 132/76 134/82  Pulse: 71 72  Resp: 20 18  Temp: 98.3 F (36.8 C) 98.2 F (36.8 C)    General: Appears in no acute distress Cardiovascular: S1-S2 regular Respiratory: Clear to auscultation bilaterally  Discharge Instructions   Discharge Instructions    Diet - low sodium heart healthy    Complete by:  As directed   Increase activity slowly    Complete by:  As directed     Current Discharge Medication List    START taking these medications   Details  amoxicillin (AMOXIL) 500 MG capsule Take 2 capsules (1,000 mg total) by mouth every 12 (twelve) hours. Qty: 20 capsule, Refills: 0    clarithromycin (BIAXIN) 500 MG tablet Take 1 tablet (500 mg total) by mouth every 12 (twelve) hours. Qty: 20 tablet, Refills: 0    pantoprazole (PROTONIX) 40 MG tablet Take 1 tablet (40 mg total) by mouth 2 (two) times daily before a meal. Qty: 20 tablet, Refills: 0      CONTINUE these medications which have NOT CHANGED   Details  aspirin 81 MG chewable tablet Chew 81 mg by mouth every morning.     carvedilol (  COREG) 6.25 MG tablet Take 3.125 mg by mouth 2 (two) times daily with a meal.     gabapentin (NEURONTIN) 300 MG capsule Take 300 mg by mouth daily as needed (sleep, restless legs.).    nitroGLYCERIN (NITROSTAT) 0.4 MG SL tablet Place 0.4 mg under the tongue every 5 (five) minutes as needed for chest pain.    pravastatin (PRAVACHOL) 40 MG tablet Take 40 mg by mouth daily.     prazosin (MINIPRESS) 1 MG capsule Take 1 mg by mouth daily as needed (nightmares). For nightmares    rosuvastatin (CRESTOR) 5 MG tablet Take 5 mg by mouth daily.    sertraline (ZOLOFT) 100 MG tablet Take 50 mg by mouth daily.     traZODone (DESYREL) 100 MG tablet Take 200 mg by mouth at bedtime.       STOP taking these medications     clopidogrel (PLAVIX) 75 MG tablet        No Known Allergies Follow-up Information    HAYES,JOHN C, MD Follow up in 1 week(s).   Specialty:  Gastroenterology Contact information: [address] [place] [address] [phone]            The results of significant diagnostics from this hospitalization (including imaging, microbiology, ancillary and laboratory) are listed below for reference.    Significant Diagnostic Studies: No results found.  Microbiology: No results found for this or any previous visit (from the past 240 hour(s)).   Labs: Basic Metabolic Panel:  Recent Labs Lab 04/27/16 0530 04/27/16 0538 04/28/16 0455 04/29/16 0422 04/30/16 0418  NA 139 141 139 139 139  K 3.7 3.7 3.8 3.8 3.7  CL 115* 111 114* 111 110  CO2 17*  --  20* 22 23  GLUCOSE 152* 151* 128* 106* 117*  BUN 42* 40* 17 9 11   CREATININE 1.30* 1.20 1.12 1.10 1.18  CALCIUM 8.4*  --  8.1* 8.1* 8.1*   Liver Function Tests: No results for input(s): AST, ALT, ALKPHOS, BILITOT, PROT, ALBUMIN in the last 168 hours. No results for input(s): LIPASE, AMYLASE in the last 168 hours. No results for input(s): AMMONIA in the last 168 hours. CBC:  Recent Labs Lab 04/27/16 0530  04/27/16 1528 04/28/16 0453 04/29/16 0422 04/30/16 0418 05/01/16 1008  WBC 10.5  --  8.0 10.2 7.5 6.6 7.8  NEUTROABS 6.1  --   --  5.5  --   --   --   HGB 8.8*  < > 6.9* 9.2* 7.8* 9.4* 10.3*  HCT 26.9*  < > 20.3* 28.1* 23.3* 29.1* 31.7*  MCV 74.1*  --  73.0* 77.8* 76.6* 80.4 80.1  PLT 160  --  125* REPEATED TO VERIFY 123* 144* 160  < > = values in this  interval not displayed. Cardiac Enzymes:  Recent Labs Lab 04/27/16 1528  TROPONINI <0.03   BNP: BNP (last 3 results) No results for input(s): BNP in the last 8760 hours.  ProBNP (last 3 results) No results for input(s): PROBNP in the last 8760 hours.  CBG:  Recent Labs Lab 04/30/16 1147 04/30/16 1816 04/30/16 2347 05/01/16 0636 05/01/16 1124  GLUCAP 169* 129* 100* 111* 99       Signed:  Raphael Espe S MD.  [facility] 05/01/2016, 12:57 PM

## 2016-05-01 NOTE — Progress Notes (Signed)
[facility] Progress Note  Subjective: The patient has a headache but otherwise feels fine. No further signs of bleeding. He was held nothing by mouth by Dr. Madilyn FiremanHayes yesterday just in case it was felt he needed another EGD and a flexible sigmoidoscopy today. His hemoglobin and hematocrit of actually gone up overnight. Patient has not noticed any bleeding today.  Objective: Vital signs in last 24 hours: Temp:  [98.2 F (36.8 C)-98.6 F (37 C)] 98.2 F (36.8 C) (09/09 0954) Pulse Rate:  [71-75] 72 (09/09 0954) Resp:  [18-20] 18 (09/09 0954) BP: (132-151)/(76-88) 134/82 (09/09 0954) SpO2:  [98 %-100 %] 98 % (09/09 0954) Weight change:    PE:  No distress  Abdomen soft nontender  Lab Results: Results for orders placed or performed during the hospital encounter of 04/27/16 (from the past 24 hour(s))  Glucose, capillary     Status: Abnormal   Collection Time: 04/30/16  6:16 PM  Result Value Ref Range   Glucose-Capillary 129 (H) 65 - 99 mg/dL  Glucose, capillary     Status: Abnormal   Collection Time: 04/30/16 11:47 PM  Result Value Ref Range   Glucose-Capillary 100 (H) 65 - 99 mg/dL  Glucose, capillary     Status: Abnormal   Collection Time: 05/01/16  6:36 AM  Result Value Ref Range   Glucose-Capillary 111 (H) 65 - 99 mg/dL  CBC     Status: Abnormal   Collection Time: 05/01/16 10:08 AM  Result Value Ref Range   WBC 7.8 4.0 - 10.5 K/uL   RBC 3.96 (L) 4.22 - 5.81 MIL/uL   Hemoglobin 10.3 (L) 13.0 - 17.0 g/dL   HCT 29.531.7 (L) 28.439.0 - 13.252.0 %   MCV 80.1 78.0 - 100.0 fL   MCH 26.0 26.0 - 34.0 pg   MCHC 32.5 30.0 - 36.0 g/dL   RDW 44.016.6 (H) 10.211.5 - 72.515.5 %   Platelets 160 150 - 400 K/uL  Glucose, capillary     Status: None   Collection Time: 05/01/16 11:24 AM  Result Value Ref Range   Glucose-Capillary 99 65 - 99 mg/dL    Studies/Results: No results found.    Assessment: Duodenal ulcer status post bleed  Minimal rectal bleeding    Plan:   He appears stable  from a GI standpoint. I think we can feed him. I think he can go home. With outpatient follow-up. He can see Dr. Madilyn FiremanHayes in the office. I do not think he needs another EGD today or a flexible sigmoidoscopy today.    Gwenevere AbbotSAM F Krisi Azua 05/01/2016, 12:32 PM  Pager: [phone] If no answer or after 5 PM call [phone] Lab Results  Component Value Date   HGB 10.3 (L) 05/01/2016   HGB 9.4 (L) 04/30/2016   HGB 7.8 (L) 04/29/2016   HCT 31.7 (L) 05/01/2016   HCT 29.1 (L) 04/30/2016   HCT 23.3 (L) 04/29/2016   ALKPHOS 59 07/24/2013   AST 16 07/24/2013   ALT 14 07/24/2013

## 2016-05-04 DIAGNOSIS — K921 Melena: Secondary | ICD-10-CM | POA: Diagnosis not present

## 2016-05-04 DIAGNOSIS — Z941 Heart transplant status: Secondary | ICD-10-CM | POA: Diagnosis not present

## 2016-05-10 DIAGNOSIS — I2510 Atherosclerotic heart disease of native coronary artery without angina pectoris: Secondary | ICD-10-CM | POA: Diagnosis not present

## 2016-05-10 DIAGNOSIS — E7800 Pure hypercholesterolemia, unspecified: Secondary | ICD-10-CM | POA: Diagnosis not present

## 2016-05-10 DIAGNOSIS — Z8719 Personal history of other diseases of the digestive system: Secondary | ICD-10-CM | POA: Diagnosis not present

## 2016-05-10 DIAGNOSIS — Z006 Encounter for examination for normal comparison and control in clinical research program: Secondary | ICD-10-CM | POA: Diagnosis not present

## 2016-06-22 DIAGNOSIS — K264 Chronic or unspecified duodenal ulcer with hemorrhage: Secondary | ICD-10-CM | POA: Diagnosis not present

## 2016-07-12 DIAGNOSIS — K263 Acute duodenal ulcer without hemorrhage or perforation: Secondary | ICD-10-CM | POA: Diagnosis not present

## 2016-07-12 DIAGNOSIS — K5710 Diverticulosis of small intestine without perforation or abscess without bleeding: Secondary | ICD-10-CM | POA: Diagnosis not present

## 2016-07-12 DIAGNOSIS — F4310 Post-traumatic stress disorder, unspecified: Secondary | ICD-10-CM | POA: Diagnosis not present

## 2016-07-12 DIAGNOSIS — I2510 Atherosclerotic heart disease of native coronary artery without angina pectoris: Secondary | ICD-10-CM | POA: Diagnosis not present

## 2016-07-12 DIAGNOSIS — E785 Hyperlipidemia, unspecified: Secondary | ICD-10-CM | POA: Diagnosis not present

## 2016-07-12 DIAGNOSIS — K269 Duodenal ulcer, unspecified as acute or chronic, without hemorrhage or perforation: Secondary | ICD-10-CM | POA: Diagnosis not present

## 2016-07-12 DIAGNOSIS — K209 Esophagitis, unspecified: Secondary | ICD-10-CM | POA: Diagnosis not present

## 2016-08-05 DIAGNOSIS — H5211 Myopia, right eye: Secondary | ICD-10-CM | POA: Diagnosis not present

## 2016-08-05 DIAGNOSIS — H04123 Dry eye syndrome of bilateral lacrimal glands: Secondary | ICD-10-CM | POA: Diagnosis not present

## 2016-08-05 DIAGNOSIS — H5202 Hypermetropia, left eye: Secondary | ICD-10-CM | POA: Diagnosis not present

## 2016-08-05 DIAGNOSIS — H4010X0 Unspecified open-angle glaucoma, stage unspecified: Secondary | ICD-10-CM | POA: Diagnosis not present

## 2016-09-21 DIAGNOSIS — H4010X0 Unspecified open-angle glaucoma, stage unspecified: Secondary | ICD-10-CM | POA: Diagnosis not present

## 2016-09-21 DIAGNOSIS — H04123 Dry eye syndrome of bilateral lacrimal glands: Secondary | ICD-10-CM | POA: Diagnosis not present

## 2016-09-21 DIAGNOSIS — R509 Fever, unspecified: Secondary | ICD-10-CM | POA: Diagnosis not present

## 2016-09-21 DIAGNOSIS — R05 Cough: Secondary | ICD-10-CM | POA: Diagnosis not present

## 2016-11-15 DIAGNOSIS — I2510 Atherosclerotic heart disease of native coronary artery without angina pectoris: Secondary | ICD-10-CM | POA: Diagnosis not present

## 2016-11-15 DIAGNOSIS — Z006 Encounter for examination for normal comparison and control in clinical research program: Secondary | ICD-10-CM | POA: Diagnosis not present

## 2016-11-15 DIAGNOSIS — Z8719 Personal history of other diseases of the digestive system: Secondary | ICD-10-CM | POA: Diagnosis not present

## 2016-11-15 DIAGNOSIS — I10 Essential (primary) hypertension: Secondary | ICD-10-CM | POA: Diagnosis not present

## 2016-12-08 DIAGNOSIS — I10 Essential (primary) hypertension: Secondary | ICD-10-CM | POA: Diagnosis not present

## 2016-12-13 DIAGNOSIS — I10 Essential (primary) hypertension: Secondary | ICD-10-CM | POA: Diagnosis not present

## 2016-12-13 DIAGNOSIS — I2510 Atherosclerotic heart disease of native coronary artery without angina pectoris: Secondary | ICD-10-CM | POA: Diagnosis not present

## 2016-12-13 DIAGNOSIS — Z006 Encounter for examination for normal comparison and control in clinical research program: Secondary | ICD-10-CM | POA: Diagnosis not present

## 2017-01-19 DIAGNOSIS — H4010X0 Unspecified open-angle glaucoma, stage unspecified: Secondary | ICD-10-CM | POA: Diagnosis not present

## 2017-04-01 DIAGNOSIS — G514 Facial myokymia: Secondary | ICD-10-CM | POA: Diagnosis not present

## 2017-04-01 DIAGNOSIS — H401130 Primary open-angle glaucoma, bilateral, stage unspecified: Secondary | ICD-10-CM | POA: Diagnosis not present

## 2017-04-01 DIAGNOSIS — H527 Unspecified disorder of refraction: Secondary | ICD-10-CM | POA: Diagnosis not present

## 2017-04-12 DIAGNOSIS — I2510 Atherosclerotic heart disease of native coronary artery without angina pectoris: Secondary | ICD-10-CM | POA: Diagnosis not present

## 2017-04-12 DIAGNOSIS — E785 Hyperlipidemia, unspecified: Secondary | ICD-10-CM | POA: Diagnosis not present

## 2017-04-12 DIAGNOSIS — K263 Acute duodenal ulcer without hemorrhage or perforation: Secondary | ICD-10-CM | POA: Diagnosis not present

## 2017-04-12 DIAGNOSIS — D649 Anemia, unspecified: Secondary | ICD-10-CM | POA: Diagnosis not present

## 2017-04-27 DIAGNOSIS — H527 Unspecified disorder of refraction: Secondary | ICD-10-CM | POA: Diagnosis not present

## 2017-04-28 DIAGNOSIS — K05322 Chronic periodontitis, generalized, moderate: Secondary | ICD-10-CM | POA: Diagnosis not present

## 2017-05-17 ENCOUNTER — Ambulatory Visit (HOSPITAL_COMMUNITY)
Admission: EM | Admit: 2017-05-17 | Discharge: 2017-05-17 | Disposition: A | Discharge status: Home / Self Care | Payer: Federal, State, Local not specified - PPO | Attending: Family Medicine | Admitting: Family Medicine

## 2017-05-17 ENCOUNTER — Encounter (HOSPITAL_COMMUNITY): Payer: Self-pay | Admitting: Emergency Medicine

## 2017-05-17 DIAGNOSIS — M545 Low back pain, unspecified: Secondary | ICD-10-CM

## 2017-05-17 MED ORDER — METHYLPREDNISOLONE ACETATE 80 MG/ML IJ SUSP
INTRAMUSCULAR | Status: AC
  Filled 2017-05-17: qty 1

## 2017-05-17 MED ORDER — METHYLPREDNISOLONE ACETATE 80 MG/ML IJ SUSP
80.0000 mg | Freq: Once | INTRAMUSCULAR | Status: AC
  Administered 2017-05-17: 80 mg via INTRAMUSCULAR

## 2017-05-17 MED ORDER — CYCLOBENZAPRINE HCL 10 MG PO TABS: 10.0000 mg | ORAL_TABLET | Freq: Two times a day (BID) | ORAL | 0 refills | Status: DC | PRN

## 2017-05-17 MED ORDER — HYDROCODONE-ACETAMINOPHEN 5-325 MG PO TABS
1.0000 | ORAL_TABLET | Freq: Four times a day (QID) | ORAL | 0 refills | Status: DC | PRN
Start: 2017-05-17 — End: 2019-10-22

## 2017-05-17 NOTE — ED Provider Notes (Signed)
[facility]    CSN: [id number] Arrival date & time: 05/17/17  1632     History   Chief Complaint Chief Complaint  Patient presents with  . Back Pain    HPI Sean Strong is a 51 y.o. male.   51 year old male with history of anxiety, CAD, depression, PTSD comes in for a few hour history of left lower back pain. He states he was loading equipment into his truck, heard his back pop with pain and numbness and tingling. He thought he had strained a muscle, and would for deep tissue massage, which exacerbated the pain. He is having trouble ambulating due to the pain. Denies saddle anesthesia, loss of bladder or bowel control. Has not taken anything for the pain.      Past Medical History:  Diagnosis Date  . Anxiety   . Coronary artery disease   . Depression   . PTSD (post-traumatic stress disorder)     Patient Active Problem List   Diagnosis Date Noted  . Upper GI bleed 04/27/2016  . Syncope 04/27/2016  . H/O heart artery stent 04/27/2016  . CAD (coronary artery disease) 04/27/2016  . Essential hypertension 04/27/2016  . Hyperglycemia 04/27/2016  . Acute GI bleeding   . S/P PTCA (percutaneous transluminal coronary angioplasty) 07/31/2013  . PTSD (post-traumatic stress disorder) 07/24/2013  . Poor dentition 07/24/2013    Past Surgical History:  Procedure Laterality Date  . CARDIAC CATHETERIZATION  07/31/2013  . COSMETIC SURGERY    . ESOPHAGOGASTRODUODENOSCOPY (EGD) WITH PROPOFOL N/A 04/27/2016   Procedure: ESOPHAGOGASTRODUODENOSCOPY (EGD) WITH PROPOFOL;  Surgeon: Dorena Cookey, MD;  Location: [facility] ENDOSCOPY;  Service: Endoscopy;  Laterality: N/A;  . LEFT HEART CATHETERIZATION WITH CORONARY ANGIOGRAM N/A 07/31/2013   Procedure: LEFT HEART CATHETERIZATION WITH CORONARY ANGIOGRAM;  Surgeon: Pamella Pert, MD;  Location: [facility] CATH LAB;  Service: Cardiovascular;  Laterality: N/A;  . PERCUTANEOUS CORONARY STENT INTERVENTION (PCI-S)  07/31/2013   MID & PROXIMAL LAD        DR Jacinto Halim  . PERCUTANEOUS CORONARY STENT INTERVENTION (PCI-S)  07/31/2013   Procedure: PERCUTANEOUS CORONARY STENT INTERVENTION (PCI-S);  Surgeon: Pamella Pert, MD;  Location: [facility] CATH LAB;  Service: Cardiovascular;;       Home Medications    Prior to Admission medications   Medication Sig Start Date End Date Taking? Authorizing Provider  aspirin 81 MG chewable tablet Chew 81 mg by mouth every morning.    Yes [provider]  carvedilol (COREG) 6.25 MG tablet Take 3.125 mg by mouth 2 (two) times daily with a meal.    Yes [provider]  gabapentin (NEURONTIN) 300 MG capsule Take 300 mg by mouth daily as needed (sleep, restless legs.).   Yes [provider]  pantoprazole (PROTONIX) 40 MG tablet Take 1 tablet (40 mg total) by mouth 2 (two) times daily before a meal. 05/01/16  Yes Sharl Ma, Sarina Ill, MD  cyclobenzaprine (FLEXERIL) 10 MG tablet Take 1 tablet (10 mg total) by mouth 2 (two) times daily as needed for muscle spasms. 05/17/17   Cathie Hoops, Jonthan Leite V, PA-C  HYDROcodone-acetaminophen (NORCO/VICODIN) 5-325 MG tablet Take 1 tablet by mouth every 6 (six) hours as needed. 05/17/17   Cathie Hoops, Devarion Mcclanahan V, PA-C  nitroGLYCERIN (NITROSTAT) 0.4 MG SL tablet Place 0.4 mg under the tongue every 5 (five) minutes as needed for chest pain.    [provider]  pravastatin (PRAVACHOL) 40 MG tablet Take 40 mg by mouth daily.    [provider]  prazosin (MINIPRESS) 1 MG capsule Take 1 mg by mouth daily as needed (nightmares). For nightmares    [provider]  rosuvastatin (CRESTOR) 5 MG tablet Take 5 mg by mouth daily.    [provider]  sertraline (ZOLOFT) 100 MG tablet Take 50 mg by mouth daily.     [provider]  traZODone (DESYREL) 100 MG tablet Take 200 mg by mouth at bedtime.     [provider]    Family History Family History  Problem Relation Age of Onset  . Diabetes Mother   . Heart disease Mother   . Kidney failure Mother     . Heart disease Sister     Social History Social History  Substance Use Topics  . Smoking status: Former Smoker    Years: 10.00    Types: Cigarettes    Quit date: 07/23/2012  . Smokeless tobacco: Never Used  . Alcohol use No     Allergies   Patient has no known allergies.   Review of Systems Review of Systems  Reason unable to perform ROS: See HPI as above.     Physical Exam Triage Vital Signs ED Triage Vitals  Enc Vitals Group     BP 05/17/17 1736 136/85     Pulse Rate 05/17/17 1736 (!) 102     Resp 05/17/17 1736 16     Temp 05/17/17 1736 98.7 F (37.1 C)     Temp Source 05/17/17 1736 Oral     SpO2 05/17/17 1736 97 %     Weight 05/17/17 1736 215 lb (97.5 kg)     Height 05/17/17 1736  (1.676 m)     Head Circumference --      Peak Flow --      Pain Score 05/17/17 1737 8     Pain Loc --      Pain Edu? --      Excl. in GC? --    No data found.   Updated Vital Signs BP 136/85   Pulse (!) 102   Temp 98.7 F (37.1 C) (Oral)   Resp 16   Ht  (1.676 m)   Wt 215 lb (97.5 kg)   SpO2 97%   BMI 34.70 kg/m      Physical Exam  Constitutional: He is oriented to person, place, and time. He appears well-developed and well-nourished. No distress.  Sitting in wheelchair, cannot get comfortable.   HENT:  Head: Normocephalic and atraumatic.  Eyes: Pupils are equal, round, and reactive to light. Conjunctivae are normal.  Cardiovascular: Normal rate, regular rhythm and normal heart sounds.  Exam reveals no gallop and no friction rub.   No murmur heard. Pulmonary/Chest: Effort normal and breath sounds normal. He has no wheezes. He has no rales.  Musculoskeletal:  Tenderness of the midline around L5 to sacral region. Tenderness of left lower lumbar region and buttocks. Attempted ROM of back with pain. Tried to ambulate patient to exam table for further examination. Patient was unable to stand due to pain. Leg raise with patient sitting in chair without  radiation of pain. Sensation intact.    Neurological: He is alert and oriented to person, place, and time.  Skin: Skin is warm and dry.     UC Treatments / Results  Labs (all labs ordered are listed, but only abnormal results are displayed) Labs Reviewed - No data to display  EKG  EKG Interpretation None       Radiology No results found.  Procedures Procedures (including critical care time)  Medications Ordered in UC Medications  methylPREDNISolone acetate (DEPO-MEDROL) injection 80 mg (80 mg Intramuscular Given 05/17/17 1810)     Initial Impression / Assessment and Plan / UC Course  I have reviewed the triage vital signs and the nursing notes.  Pertinent labs & imaging results that were available during my care of the patient were reviewed by me and considered in my medical decision making (see chart for details).    Patient with history of acute upper GI bleed requiring blood transfusions and cannot take NSAIDs. Norco as directed for pain, muscle relaxant for muscle spasms. Depomedrol injection in office. Given patient unable to ambulate, imaging was not obtained today. Patient to follow up with orthopedics for further evaluation and treatment needed. Return precautions given.   Final Clinical Impressions(s) / UC Diagnoses   Final diagnoses:  Acute left-sided low back pain without sciatica    New Prescriptions Discharge Medication List as of 05/17/2017  6:09 PM    START taking these medications   Details  cyclobenzaprine (FLEXERIL) 10 MG tablet Take 1 tablet (10 mg total) by mouth 2 (two) times daily as needed for muscle spasms., Starting Tue 05/17/2017, Normal    HYDROcodone-acetaminophen (NORCO/VICODIN) 5-325 MG tablet Take 1 tablet by mouth every 6 (six) hours as needed., Starting Tue 05/17/2017, Print         Controlled Substance Prescriptions [place] Controlled Substance Registry consulted? Yes, I have consulted the [place] Controlled Substances Registry for this  patient, and feel the risk/benefit ratio today is favorable for proceeding with this prescription for a controlled substance.   Belinda Fisher, PA-C 05/17/17 1954

## 2017-05-17 NOTE — Discharge Instructions (Signed)
Steroid injection in office. Start Norco for pain as needed. Do not take tylenol as it is a component in norco. Flexeril as needed at night. Flexeril can make you drowsy, so do not take if you are going to drive, operate heavy machinery, or make important decisions. Ice/heat compresses as needed. This can take up to 3-4 weeks to completely resolve, but you should be feeling better each week. Follow up here or with PCP if symptoms worsen, changes for reevaluation. If experience numbness/tingling of the inner thighs, loss of bladder or bowel control, go to the emergency department for evaluation.

## 2017-05-17 NOTE — ED Triage Notes (Signed)
PT reports he "popped" his lower back while loading equipment on a truck. PT then got a deep tissue massage thinking it would decrease the pain. It made pain worse.

## 2017-06-02 DIAGNOSIS — M5442 Lumbago with sciatica, left side: Secondary | ICD-10-CM | POA: Diagnosis not present

## 2017-06-02 DIAGNOSIS — M545 Low back pain: Secondary | ICD-10-CM | POA: Diagnosis not present

## 2017-07-18 DIAGNOSIS — K0510 Chronic gingivitis, plaque induced: Secondary | ICD-10-CM | POA: Diagnosis not present

## 2017-07-29 DIAGNOSIS — I10 Essential (primary) hypertension: Secondary | ICD-10-CM | POA: Diagnosis not present

## 2017-07-29 DIAGNOSIS — I2510 Atherosclerotic heart disease of native coronary artery without angina pectoris: Secondary | ICD-10-CM | POA: Diagnosis not present

## 2017-07-29 DIAGNOSIS — Z006 Encounter for examination for normal comparison and control in clinical research program: Secondary | ICD-10-CM | POA: Diagnosis not present

## 2017-08-09 DIAGNOSIS — H527 Unspecified disorder of refraction: Secondary | ICD-10-CM | POA: Diagnosis not present

## 2017-08-09 DIAGNOSIS — H4010X0 Unspecified open-angle glaucoma, stage unspecified: Secondary | ICD-10-CM | POA: Diagnosis not present

## 2017-10-20 DIAGNOSIS — K036 Deposits [accretions] on teeth: Secondary | ICD-10-CM | POA: Diagnosis not present

## 2017-10-27 DIAGNOSIS — K036 Deposits [accretions] on teeth: Secondary | ICD-10-CM | POA: Diagnosis not present

## 2017-11-07 DIAGNOSIS — H04123 Dry eye syndrome of bilateral lacrimal glands: Secondary | ICD-10-CM | POA: Diagnosis not present

## 2017-11-07 DIAGNOSIS — H4010X0 Unspecified open-angle glaucoma, stage unspecified: Secondary | ICD-10-CM | POA: Diagnosis not present

## 2018-02-06 DIAGNOSIS — I10 Essential (primary) hypertension: Secondary | ICD-10-CM | POA: Diagnosis not present

## 2018-02-06 DIAGNOSIS — Z006 Encounter for examination for normal comparison and control in clinical research program: Secondary | ICD-10-CM | POA: Diagnosis not present

## 2018-02-06 DIAGNOSIS — R7989 Other specified abnormal findings of blood chemistry: Secondary | ICD-10-CM | POA: Diagnosis not present

## 2018-02-06 DIAGNOSIS — I2510 Atherosclerotic heart disease of native coronary artery without angina pectoris: Secondary | ICD-10-CM | POA: Diagnosis not present

## 2018-04-13 DIAGNOSIS — K06020 Generalized gingival recession, unspecified: Secondary | ICD-10-CM | POA: Diagnosis not present

## 2018-04-13 DIAGNOSIS — K0510 Chronic gingivitis, plaque induced: Secondary | ICD-10-CM | POA: Diagnosis not present

## 2018-04-13 DIAGNOSIS — K036 Deposits [accretions] on teeth: Secondary | ICD-10-CM | POA: Diagnosis not present

## 2018-04-13 DIAGNOSIS — K05323 Chronic periodontitis, generalized, severe: Secondary | ICD-10-CM | POA: Diagnosis not present

## 2018-04-21 DIAGNOSIS — R7989 Other specified abnormal findings of blood chemistry: Secondary | ICD-10-CM | POA: Diagnosis not present

## 2018-04-21 DIAGNOSIS — I10 Essential (primary) hypertension: Secondary | ICD-10-CM | POA: Diagnosis not present

## 2018-04-21 DIAGNOSIS — E7800 Pure hypercholesterolemia, unspecified: Secondary | ICD-10-CM | POA: Diagnosis not present

## 2018-04-21 DIAGNOSIS — I2510 Atherosclerotic heart disease of native coronary artery without angina pectoris: Secondary | ICD-10-CM | POA: Diagnosis not present

## 2018-07-05 DIAGNOSIS — K625 Hemorrhage of anus and rectum: Secondary | ICD-10-CM | POA: Diagnosis not present

## 2018-07-05 DIAGNOSIS — R1013 Epigastric pain: Secondary | ICD-10-CM | POA: Diagnosis not present

## 2018-07-05 DIAGNOSIS — Z8719 Personal history of other diseases of the digestive system: Secondary | ICD-10-CM | POA: Diagnosis not present

## 2018-07-10 DIAGNOSIS — K08421 Partial loss of teeth due to periodontal diseases, class I: Secondary | ICD-10-CM | POA: Diagnosis not present

## 2018-07-18 DIAGNOSIS — K601 Chronic anal fissure: Secondary | ICD-10-CM | POA: Diagnosis not present

## 2018-07-18 DIAGNOSIS — D128 Benign neoplasm of rectum: Secondary | ICD-10-CM | POA: Diagnosis not present

## 2018-07-18 DIAGNOSIS — D1779 Benign lipomatous neoplasm of other sites: Secondary | ICD-10-CM | POA: Diagnosis not present

## 2018-07-18 DIAGNOSIS — K922 Gastrointestinal hemorrhage, unspecified: Secondary | ICD-10-CM | POA: Diagnosis not present

## 2018-08-02 DIAGNOSIS — K219 Gastro-esophageal reflux disease without esophagitis: Secondary | ICD-10-CM | POA: Diagnosis not present

## 2018-08-02 DIAGNOSIS — Z86010 Personal history of colonic polyps: Secondary | ICD-10-CM | POA: Diagnosis not present

## 2018-08-02 DIAGNOSIS — K602 Anal fissure, unspecified: Secondary | ICD-10-CM | POA: Diagnosis not present

## 2018-08-03 DIAGNOSIS — H4010X0 Unspecified open-angle glaucoma, stage unspecified: Secondary | ICD-10-CM | POA: Diagnosis not present

## 2018-08-03 DIAGNOSIS — H401113 Primary open-angle glaucoma, right eye, severe stage: Secondary | ICD-10-CM | POA: Diagnosis not present

## 2018-08-03 DIAGNOSIS — H401121 Primary open-angle glaucoma, left eye, mild stage: Secondary | ICD-10-CM | POA: Diagnosis not present

## 2018-08-03 DIAGNOSIS — H2513 Age-related nuclear cataract, bilateral: Secondary | ICD-10-CM | POA: Diagnosis not present

## 2018-08-29 DIAGNOSIS — K08431 Partial loss of teeth due to caries, class I: Secondary | ICD-10-CM | POA: Diagnosis not present

## 2018-09-11 DIAGNOSIS — F419 Anxiety disorder, unspecified: Secondary | ICD-10-CM

## 2018-09-11 DIAGNOSIS — E782 Mixed hyperlipidemia: Secondary | ICD-10-CM

## 2018-09-11 DIAGNOSIS — F32A Depression, unspecified: Secondary | ICD-10-CM

## 2018-09-11 DIAGNOSIS — F329 Major depressive disorder, single episode, unspecified: Secondary | ICD-10-CM | POA: Insufficient documentation

## 2018-09-11 DIAGNOSIS — I209 Angina pectoris, unspecified: Secondary | ICD-10-CM

## 2018-09-11 DIAGNOSIS — E785 Hyperlipidemia, unspecified: Secondary | ICD-10-CM | POA: Insufficient documentation

## 2018-09-15 DIAGNOSIS — H527 Unspecified disorder of refraction: Secondary | ICD-10-CM | POA: Diagnosis not present

## 2018-09-15 DIAGNOSIS — H4010X3 Unspecified open-angle glaucoma, severe stage: Secondary | ICD-10-CM | POA: Diagnosis not present

## 2018-09-15 DIAGNOSIS — H4010X1 Unspecified open-angle glaucoma, mild stage: Secondary | ICD-10-CM | POA: Diagnosis not present

## 2018-10-26 ENCOUNTER — Ambulatory Visit: Payer: Self-pay | Admitting: Cardiology

## 2018-12-15 ENCOUNTER — Other Ambulatory Visit: Payer: Self-pay | Admitting: Cardiology

## 2018-12-15 DIAGNOSIS — I10 Essential (primary) hypertension: Secondary | ICD-10-CM

## 2018-12-15 MED ORDER — LOSARTAN POTASSIUM-HCTZ 100-12.5 MG PO TABS: 1.0000 | ORAL_TABLET | Freq: Every day | ORAL | 3 refills | Status: AC

## 2018-12-18 DIAGNOSIS — E785 Hyperlipidemia, unspecified: Secondary | ICD-10-CM | POA: Diagnosis not present

## 2019-09-06 NOTE — Progress Notes (Signed)
Primary Physician/Referring:  [facility]  Patient ID: Sean Strong, male    DOB: May 22, 1966, 54 y.o.   MRN: [id number]  No chief complaint on file.  HPI:    Sean Strong  is a 54 y.o. African-American male with coronary artery disease status post proximal to mid LAD PCI 2014, history of duodenal ulcer secondary to H. pylori status post triple therapy treatment in 2017 with no recurrence, enrolled in Espirion trial for hyperlipidemia due to statin intolerance, hypertension, chronically elevated CK enzymes, anxiety and depression and posttraumatic stress disorder, who is a veteran presents for annual visit.   He has been working hard as a Development worker, international aid and has not noticed any chest discomfort with heavy exertional activity. Yesterday he had a fall and had bruises on his skin in his hands and also leg and has hairline fracture of his clavicle After he fell off of a ladder.  Past Medical History:  Diagnosis Date  . Angina pectoris ([place])   . Anxiety   . Anxiety   . Arthralgia of both lower legs   . Coronary artery disease   . Depression   . Hyperlipidemia   . Hypertension   . PTSD (post-traumatic stress disorder)    Past Surgical History:  Procedure Laterality Date  . CARDIAC CATHETERIZATION  07/31/2013  . COSMETIC SURGERY    . ESOPHAGOGASTRODUODENOSCOPY (EGD) WITH PROPOFOL N/A 04/27/2016   Procedure: ESOPHAGOGASTRODUODENOSCOPY (EGD) WITH PROPOFOL;  Surgeon: Teena Irani, MD;  Location: [facility];  Service: Endoscopy;  Laterality: N/A;  . LEFT HEART CATHETERIZATION WITH CORONARY ANGIOGRAM N/A 07/31/2013   Procedure: LEFT HEART CATHETERIZATION WITH CORONARY ANGIOGRAM;  Surgeon: Laverda Page, MD;  Location: [facility] CATH LAB;  Service: Cardiovascular;  Laterality: N/A;  . PERCUTANEOUS CORONARY STENT INTERVENTION (PCI-S)  07/31/2013   MID & PROXIMAL LAD       DR Einar Gip  . PERCUTANEOUS CORONARY STENT INTERVENTION (PCI-S)  07/31/2013   Procedure: PERCUTANEOUS CORONARY STENT  INTERVENTION (PCI-S);  Surgeon: Laverda Page, MD;  Location: [facility] CATH LAB;  Service: Cardiovascular;;   Social History   Tobacco Use  . Smoking status: Former Smoker    Years: 10.00    Types: Cigarettes    Quit date: 07/23/2012    Years since quitting: 7.1  . Smokeless tobacco: Never Used  Substance Use Topics  . Alcohol use: No    ROS  Review of Systems  Constitution: Negative for weight gain.  Cardiovascular: Negative for dyspnea on exertion, leg swelling and syncope.  Respiratory: Negative for hemoptysis.   Endocrine: Negative for cold intolerance.  Hematologic/Lymphatic: Does not bruise/bleed easily.  Musculoskeletal: Positive for joint pain.  Gastrointestinal: Negative for hematochezia and melena.  Genitourinary: Positive for decreased libido.  Neurological: Negative for headaches and light-headedness.  Psychiatric/Behavioral: The patient is nervous/anxious.    Objective  Blood pressure 134/78, pulse 82, temperature 97.6 F (36.4 C), temperature source Temporal, resp. rate 16, height 5' 9" (1.753 m), weight 239 lb (108.4 kg), SpO2 96 %.  Vitals with BMI 09/07/2019 09/11/2018 05/17/2017  Height 5' 9" 5' 9" 5' 6"  Weight 239 lbs 221 lbs 5 oz 215 lbs  BMI 35.28 75.79 72.82  Systolic 060 156 153  Diastolic 78 79 85  Pulse 82 96 102     Physical Exam  Constitutional:  He is well-built and mildly obese in no acute distress.  Neck: No thyromegaly present.  Cardiovascular: Normal rate, regular rhythm, normal heart sounds and intact distal pulses. Exam reveals no  gallop.  No murmur heard. No leg edema, no JVD.  Pulmonary/Chest: Effort normal and breath sounds normal.  Abdominal: Soft. Bowel sounds are normal.  Musculoskeletal:     Cervical back: Neck supple.  Skin: Skin is warm and dry.   Laboratory examination:   No results for input(s): NA, K, CL, CO2, GLUCOSE, BUN, CREATININE, CALCIUM, GFRNONAA, GFRAA in the last 8760 hours. CrCl cannot be calculated (Patient's  most recent lab result is older than the maximum 21 days allowed.).  CMP Latest Ref Rng & Units 04/30/2016 04/29/2016 04/28/2016  Glucose 65 - 99 mg/dL 117(H) 106(H) 128(H)  BUN 6 - 20 mg/dL _0 Creatinine 0.61 - 1.24 mg/dL 1.18 1.10 1.12  Sodium 135 - 145 mmol/L 139 139 139  Potassium 3.5 - 5.1 mmol/L 3.7 3.8 3.8  Chloride 101 - 111 mmol/L 110 111 114(H)  CO2 22 - 32 mmol/L 23 22 20(L)  Calcium 8.9 - 10.3 mg/dL 8.1(L) 8.1(L) 8.1(L)  Total Protein 6.0 - 8.3 g/dL - - -  Total Bilirubin 0.3 - 1.2 mg/dL - - -  Alkaline Phos 39 - 117 U/L - - -  AST 0 - 37 U/L - - -  ALT 0 - 53 U/L - - -   CBC Latest Ref Rng & Units 05/01/2016 04/30/2016 04/29/2016  WBC 4.0 - 10.5 K/uL 7.8 6.6 7.5  Hemoglobin 13.0 - 17.0 g/dL 10.3(L) 9.4(L) 7.8(L)  Hematocrit 39.0 - 52.0 % 31.7(L) 29.1(L) 23.3(L)  Platelets 150 - 400 K/uL 160 144(L) 123(L)   Lipid Panel     Component Value Date/Time   CHOL 230 (H) 07/24/2013 1025   TRIG 146 07/24/2013 1025   HDL 34 (L) 07/24/2013 1025   CHOLHDL 6.8 07/24/2013 1025   VLDL 29 07/24/2013 1025   LDLCALC 167 (H) 07/24/2013 1025   HEMOGLOBIN A1C No results found for: HGBA1C, MPG TSH No results for input(s): TSH in the last 8760 hours.   Labs 01/19/2018: HB 12.8/HCT 41.2, platelets 230, normal indicis. BUN 14, creatinine 1.09, serum glucose 84 mg, eGFR greater than 60 ML. LFTs normal. Total CPK 313, uric acid 8.4 mildly elevated.  Lipids not performed as patient is in a trial compared to 07/12/17, CPK reduced from 405 and stable from May 2018 where it was 321.  Medications and allergies   Allergies  Allergen Reactions  . Crestor [Rosuvastatin Calcium] Other (See Comments)    Arthralgia  . Lipitor [Atorvastatin Calcium] Other (See Comments)    Myalgia     Current Outpatient Medications  Medication Instructions  . amLODipine (NORVASC) 5 mg, Oral, Daily  . aspirin 81 mg, Oral,  Every morning - 10a  . bacitracin ointment 1 application, Topical, 2 times daily  .  carvedilol (COREG) 3.125 mg, Oral, 2 times daily with meals  . cyclobenzaprine (FLEXERIL) 10 mg, Oral, 2 times daily PRN  . HYDROcodone-acetaminophen (NORCO/VICODIN) 5-325 MG tablet 1 tablet, Oral, Every 6 hours PRN  . losartan-hydrochlorothiazide (HYZAAR) 100-12.5 MG tablet 1 tablet, Oral, Daily  . MENTHOL-METHYL SALICYLATE EX Apply externally  . naproxen (NAPROSYN) 375 mg, Oral, 2 times daily with meals  . nitroGLYCERIN (NITROSTAT) 0.4 mg, Sublingual, Every 5 min PRN  . pantoprazole (PROTONIX) 40 mg, Oral, 2 times daily before meals    Radiology:  No results found.  Cardiac Studies:   Nuclear stress test  Exercise sestamibi stress 07/27/13: 1. Resting EKG showed normal sinus rhythm. Stress EKG was positive for ischemia. There was 2.5 mm additional ST depression noted in  the inferior and anterior leads at peak exercise, which resolved at > 3 minutes into recovery. Patient exercised on BRUCE PROTOCOL for 5 minutes 42 seconds. The maximum work level achieved was 7.3 MET's. The test was terminated due to fatigue, chest pain and achievement of the target heart rate. 2. Perfusion images reveal reversible anterior and anteroapical ischemia. Dynamic gated images reveal the left ventricular ejection fraction to be moderate to severely depressed and was estimated to be 37%. 3. This is a high risk study. Consider further cardiac work-up including cardiac catheterization.  Echocardiogram 07/30/13:  1. Left ventricular cavity is normal in size. Moderate concentric hypertrophy. Normal diastolic filling. Normal global wall motion. Normal systolic function. Calculated EF 61%.  Coronary angiogram 07/31/13:  Overlapping 4.0 x 41m, & 3.5 x 398mPromos Premier DES in prox. & mid LAD. Other arteries did not have any significant lesions. Normal LV gram, EF- 55%.   Assessment     ICD-10-CM   1. Atherosclerosis of native coronary artery of native heart with stable angina pectoris (HCEngelhard I25.118 EKG 12-Lead    2. Essential hypertension  I10 CMP14+EGFR    CBC    TSH  3. Pure hypercholesterolemia  E78.00 Lipoprotein A (LPA)    Lipid Panel With LDL/HDL Ratio    LDL cholesterol, direct  4. PTSD (post-traumatic stress disorder)  F43.10   5. Hyperglycemia  R73.9 Hgb A1c w/o eAG    EKG 09/07/2019: Normal sinus rhythm at rate of 72 bpm, normal axis.  No evidence of ischemia, normal EKG.   No orders of the defined types were placed in this encounter.   Medications Discontinued During This Encounter  Medication Reason  . gabapentin (NEURONTIN) 300 MG capsule Error  . Investigational - Study Medication Error  . pravastatin (PRAVACHOL) 40 MG tablet Error  . prazosin (MINIPRESS) 1 MG capsule Error  . rosuvastatin (CRESTOR) 5 MG tablet Error  . sertraline (ZOLOFT) 100 MG tablet Error  . tadalafil (ADCIRCA/CIALIS) 20 MG tablet Error  . traZODone (DESYREL) 100 MG tablet Error     Recommendations:   Sean JEANSONNEis a [age].o. African-American male who is a [employer] and has PTSD,  coronary artery disease status post proximal to mid LAD PCI 2014, history of duodenal ulcer secondary to H. pylori status post triple therapy treatment in 2017 with no recurrence, enrolled in Espirion trial for hyperlipidemia due to statin intolerance, hypertension, chronically elevated CK enzymes, anxiety and depression and posttraumatic stress disorder, who is a veteran presents for annual visit.  Patient is here on annual visit, remains asymptomatic without any chest pain, no clinical evidence of heart failure, EKG reveals normal sinus rhythm.  Blood pressure is well controlled.  Previously he was on statin intolerant trial with Bempidoic acid but dropped off, now on low dose crestor due to statin intolerance and CK leak.   I'll obtain labs including CBC, CMP and TSH and also A1c.  He has had GI bleed in the past about 2 years ago and was anemic after he was on Plavix for long-term.  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  Will obtain CBC. I will see him back in 6 weeks.  JaAdrian ProwsMD, FAWadley Regional Medical Center At Hope/15/2021, 3:28 PM PiThe Ranchardiovascular. PASloatsburgffice: [phone]

## 2019-09-07 ENCOUNTER — Encounter: Payer: Self-pay | Admitting: Cardiology

## 2019-09-07 ENCOUNTER — Ambulatory Visit (INDEPENDENT_AMBULATORY_CARE_PROVIDER_SITE_OTHER): Payer: Federal, State, Local not specified - PPO | Admitting: Cardiology

## 2019-09-07 ENCOUNTER — Other Ambulatory Visit: Payer: Self-pay

## 2019-09-07 VITALS — BP 134/78 | HR 82 | Temp 97.6°F | Resp 16 | Ht 69.0 in | Wt 239.0 lb

## 2019-09-07 DIAGNOSIS — S80811A Abrasion, right lower leg, initial encounter: Secondary | ICD-10-CM | POA: Diagnosis not present

## 2019-09-07 DIAGNOSIS — E7800 Pure hypercholesterolemia, unspecified: Secondary | ICD-10-CM | POA: Diagnosis not present

## 2019-09-07 DIAGNOSIS — I10 Essential (primary) hypertension: Secondary | ICD-10-CM | POA: Diagnosis not present

## 2019-09-07 DIAGNOSIS — M19011 Primary osteoarthritis, right shoulder: Secondary | ICD-10-CM | POA: Diagnosis not present

## 2019-09-07 DIAGNOSIS — I25118 Atherosclerotic heart disease of native coronary artery with other forms of angina pectoris: Secondary | ICD-10-CM | POA: Diagnosis not present

## 2019-09-07 DIAGNOSIS — F4310 Post-traumatic stress disorder, unspecified: Secondary | ICD-10-CM | POA: Diagnosis not present

## 2019-09-07 DIAGNOSIS — W11XXXA Fall on and from ladder, initial encounter: Secondary | ICD-10-CM | POA: Diagnosis not present

## 2019-09-07 DIAGNOSIS — R739 Hyperglycemia, unspecified: Secondary | ICD-10-CM

## 2019-09-07 DIAGNOSIS — M25511 Pain in right shoulder: Secondary | ICD-10-CM | POA: Diagnosis not present

## 2019-09-07 DIAGNOSIS — S51811A Laceration without foreign body of right forearm, initial encounter: Secondary | ICD-10-CM | POA: Diagnosis not present

## 2019-09-25 DIAGNOSIS — H401113 Primary open-angle glaucoma, right eye, severe stage: Secondary | ICD-10-CM | POA: Diagnosis not present

## 2019-09-25 DIAGNOSIS — G4730 Sleep apnea, unspecified: Secondary | ICD-10-CM | POA: Diagnosis not present

## 2019-09-25 DIAGNOSIS — H401122 Primary open-angle glaucoma, left eye, moderate stage: Secondary | ICD-10-CM | POA: Diagnosis not present

## 2019-09-28 DIAGNOSIS — E7800 Pure hypercholesterolemia, unspecified: Secondary | ICD-10-CM | POA: Diagnosis not present

## 2019-09-28 DIAGNOSIS — I10 Essential (primary) hypertension: Secondary | ICD-10-CM | POA: Diagnosis not present

## 2019-09-28 DIAGNOSIS — R739 Hyperglycemia, unspecified: Secondary | ICD-10-CM | POA: Diagnosis not present

## 2019-09-29 LAB — HGB A1C W/O EAG: Hgb A1c MFr Bld: 6.3 % — ABNORMAL HIGH (ref 4.8–5.6)

## 2019-09-30 LAB — CMP14+EGFR
ALT: 11 IU/L (ref 0–44)
AST: 16 IU/L (ref 0–40)
Albumin/Globulin Ratio: 1.3 (ref 1.2–2.2)
Albumin: 4.1 g/dL (ref 3.8–4.9)
Alkaline Phosphatase: 61 IU/L (ref 39–117)
BUN/Creatinine Ratio: 14 (ref 9–20)
BUN: 16 mg/dL (ref 6–24)
Bilirubin Total: 0.8 mg/dL (ref 0.0–1.2)
CO2: 23 mmol/L (ref 20–29)
Calcium: 9.2 mg/dL (ref 8.7–10.2)
Chloride: 99 mmol/L (ref 96–106)
Creatinine, Ser: 1.17 mg/dL (ref 0.76–1.27)
GFR calc Af Amer: 81 mL/min/{1.73_m2} (ref >59)
GFR calc non Af Amer: 70 mL/min/{1.73_m2} (ref >59)
Globulin, Total: 3.2 g/dL (ref 1.5–4.5)
Glucose: 99 mg/dL (ref 65–99)
Potassium: 3.1 mmol/L — ABNORMAL LOW (ref 3.5–5.2)
Sodium: 137 mmol/L (ref 134–144)
Total Protein: 7.3 g/dL (ref 6.0–8.5)

## 2019-09-30 LAB — LIPID PANEL WITH LDL/HDL RATIO
Cholesterol, Total: 227 mg/dL — ABNORMAL HIGH (ref 100–199)
HDL: 39 mg/dL — ABNORMAL LOW (ref >39)
LDL Chol Calc (NIH): 168 mg/dL — ABNORMAL HIGH (ref 0–99)
LDL/HDL Ratio: 4.3 ratio — ABNORMAL HIGH (ref 0.0–3.6)
Triglycerides: 109 mg/dL (ref 0–149)
VLDL Cholesterol Cal: 20 mg/dL (ref 5–40)

## 2019-09-30 LAB — LDL CHOLESTEROL, DIRECT: LDL Direct: 169 mg/dL — ABNORMAL HIGH (ref 0–99)

## 2019-09-30 LAB — TSH: TSH: 1.39 u[IU]/mL (ref 0.450–4.500)

## 2019-09-30 LAB — CBC
Hematocrit: 41 % (ref 37.5–51.0)
Hemoglobin: 13.7 g/dL (ref 13.0–17.7)
MCH: 24.4 pg — ABNORMAL LOW (ref 26.6–33.0)
MCHC: 33.4 g/dL (ref 31.5–35.7)
MCV: 73 fL — ABNORMAL LOW (ref 79–97)
Platelets: 207 10*3/uL (ref 150–450)
RBC: 5.61 x10E6/uL (ref 4.14–5.80)
RDW: 15.4 % (ref 11.6–15.4)
WBC: 5.6 10*3/uL (ref 3.4–10.8)

## 2019-09-30 LAB — LIPOPROTEIN A (LPA): Lipoprotein (a): 33.2 nmol/L (ref <75.0)

## 2019-10-09 DIAGNOSIS — B354 Tinea corporis: Secondary | ICD-10-CM | POA: Diagnosis not present

## 2019-10-12 DIAGNOSIS — R0981 Nasal congestion: Secondary | ICD-10-CM | POA: Diagnosis not present

## 2019-10-12 DIAGNOSIS — Z20822 Contact with and (suspected) exposure to covid-19: Secondary | ICD-10-CM | POA: Diagnosis not present

## 2019-10-12 DIAGNOSIS — R05 Cough: Secondary | ICD-10-CM | POA: Diagnosis not present

## 2019-10-17 DIAGNOSIS — F4310 Post-traumatic stress disorder, unspecified: Secondary | ICD-10-CM | POA: Diagnosis not present

## 2019-10-17 DIAGNOSIS — I2510 Atherosclerotic heart disease of native coronary artery without angina pectoris: Secondary | ICD-10-CM | POA: Diagnosis not present

## 2019-10-17 DIAGNOSIS — Z0000 Encounter for general adult medical examination without abnormal findings: Secondary | ICD-10-CM | POA: Diagnosis not present

## 2019-10-17 DIAGNOSIS — E785 Hyperlipidemia, unspecified: Secondary | ICD-10-CM | POA: Diagnosis not present

## 2019-10-17 DIAGNOSIS — Z955 Presence of coronary angioplasty implant and graft: Secondary | ICD-10-CM | POA: Diagnosis not present

## 2019-10-21 NOTE — Progress Notes (Signed)
Primary Physician/Referring:  [facility]  Patient ID: Sean Strong, male    DOB: April 02, 1966, 54 y.o.   MRN: [id number]  Chief Complaint  Patient presents with  . Coronary Artery Disease  . Hypertension  . Follow-up    6 week  . Results    lab   HPI:    Sean Strong  is a 54 y.o. African-American male with coronary artery disease status post proximal to mid LAD PCI 2014, history of duodenal ulcer secondary to H. pylori status post triple therapy treatment in 2017 with no recurrence, enrolled in Espirion trial for hyperlipidemia due to statin intolerance, hypertension, chronically elevated CK enzymes, anxiety and depression and posttraumatic stress disorder, who is a veteran presents for annual visit.   I had seen him 6 weeks ago, he had had a fall and hairline fracture of his clavicle.  I obtained labs and he now presents for follow-up.  Presently doing well and denies angina, dyspnea, leg edema.  Past Medical History:  Diagnosis Date  . Angina pectoris ([place])   . Anxiety   . Anxiety   . Arthralgia of both lower legs   . Coronary artery disease   . Depression   . Hyperlipidemia   . Hypertension   . PTSD (post-traumatic stress disorder)    Past Surgical History:  Procedure Laterality Date  . CARDIAC CATHETERIZATION  07/31/2013  . COSMETIC SURGERY    . ESOPHAGOGASTRODUODENOSCOPY (EGD) WITH PROPOFOL N/A 04/27/2016   Procedure: ESOPHAGOGASTRODUODENOSCOPY (EGD) WITH PROPOFOL;  Surgeon: Teena Irani, MD;  Location: [facility];  Service: Endoscopy;  Laterality: N/A;  . LEFT HEART CATHETERIZATION WITH CORONARY ANGIOGRAM N/A 07/31/2013   Procedure: LEFT HEART CATHETERIZATION WITH CORONARY ANGIOGRAM;  Surgeon: Laverda Page, MD;  Location: [facility] CATH LAB;  Service: Cardiovascular;  Laterality: N/A;  . PERCUTANEOUS CORONARY STENT INTERVENTION (PCI-S)  07/31/2013   MID & PROXIMAL LAD       DR Einar Gip  . PERCUTANEOUS CORONARY STENT INTERVENTION (PCI-S)  07/31/2013   Procedure: PERCUTANEOUS CORONARY STENT INTERVENTION (PCI-S);  Surgeon: Laverda Page, MD;  Location: [facility] CATH LAB;  Service: Cardiovascular;;   Social History   Tobacco Use  . Smoking status: Former Smoker    Packs/day: 1.00    Years: 10.00    Pack years: 10.00    Types: Cigarettes    Quit date: 07/23/2012    Years since quitting: 7.2  . Smokeless tobacco: Never Used  Substance Use Topics  . Alcohol use: No    ROS  Review of Systems  Cardiovascular: Negative for chest pain, dyspnea on exertion and leg swelling.  Musculoskeletal: Positive for joint pain.  Gastrointestinal: Negative for melena.  Genitourinary: Positive for decreased libido.  Psychiatric/Behavioral: The patient is nervous/anxious.    Objective  Blood pressure 122/80, pulse 67, temperature 98 F (36.7 C), height _0  (1.727 m), weight 240 lb (108.9 kg), SpO2 97 %.  Vitals with BMI 10/22/2019 09/07/2019 09/11/2018  Height _1  _2  _3   Weight 240 lbs 239 lbs 221 lbs 5 oz  BMI 36.5 48.18 56.31  Systolic 497 026 378  Diastolic 80 78 79  Pulse 67 82 96     Physical Exam  Constitutional:  He is well-built and mildly obese in no acute distress.  Neck: No thyromegaly present.  Cardiovascular: Normal rate, regular rhythm, normal heart sounds and intact distal pulses. Exam reveals no gallop.  No murmur heard. No leg edema, no JVD.  Pulmonary/Chest: Effort normal and  breath sounds normal.  Abdominal: Soft. Bowel sounds are normal.  Musculoskeletal:     Cervical back: Neck supple.  Skin: Skin is warm and dry.   Laboratory examination:   Recent Labs    09/28/19 0812  NA 137  K 3.1*  CL 99  CO2 23  GLUCOSE 99  BUN 16  CREATININE 1.17  CALCIUM 9.2  GFRNONAA 70  GFRAA 81   CrCl cannot be calculated (Patient's most recent lab result is older than the maximum 21 days allowed.).  CMP Latest Ref Rng & Units 09/28/2019 04/30/2016 04/29/2016  Glucose 65 - 99 mg/dL 99 117(H) 106(H)  BUN 6 - 24 mg/dL _0 Creatinine 0.76 - 1.27 mg/dL 1.17 1.18 1.10  Sodium 134 - 144 mmol/L 137 139 139  Potassium 3.5 - 5.2 mmol/L 3.1(L) 3.7 3.8  Chloride 96 - 106 mmol/L 99 110 111  CO2 20 - 29 mmol/L _1 Calcium 8.7 - 10.2 mg/dL 9.2 8.1(L) 8.1(L)  Total Protein 6.0 - 8.5 g/dL 7.3 - -  Total Bilirubin 0.0 - 1.2 mg/dL 0.8 - -  Alkaline Phos 39 - 117 IU/L 61 - -  AST 0 - 40 IU/L 16 - -  ALT 0 - 44 IU/L 11 - -   CBC Latest Ref Rng & Units 09/28/2019 05/01/2016 04/30/2016  WBC 3.4 - 10.8 x10E3/uL 5.6 7.8 6.6  Hemoglobin 13.0 - 17.7 g/dL 13.7 10.3(L) 9.4(L)  Hematocrit 37.5 - 51.0 % 41.0 31.7(L) 29.1(L)  Platelets 150 - 450 x10E3/uL 207 160 144(L)   Lipid Panel     Component Value Date/Time   CHOL 227 (H) 09/28/2019 0812   TRIG 109 09/28/2019 0812   HDL 39 (L) 09/28/2019 0812   CHOLHDL 6.8 07/24/2013 1025   VLDL 29 07/24/2013 1025   LDLCALC 168 (H) 09/28/2019 0812   LDLDIRECT 169 (H) 09/28/2019 0812   HEMOGLOBIN A1C Lab Results  Component Value Date   HGBA1C 6.3 (H) 09/28/2019   TSH Recent Labs    09/28/19 0812  TSH 1.390     Labs 01/19/2018: HB 12.8/HCT 41.2, platelets 230, normal indicis. BUN 14, creatinine 1.09, serum glucose 84 mg, eGFR greater than 60 ML. LFTs normal. Total CPK 313, uric acid 8.4 mildly elevated.  Lipids not performed as patient is in a trial compared to 07/12/17, CPK reduced from 405 and stable from May 2018 where it was 321.  Medications and allergies   Allergies  Allergen Reactions  . Crestor [Rosuvastatin Calcium] Other (See Comments)    Arthralgia  . Lipitor [Atorvastatin Calcium] Other (See Comments)    Myalgia    Current Outpatient Medications  Medication Instructions  . amLODipine (NORVASC) 5 mg, Oral, Daily  . aspirin 81 mg, Oral,  Every morning - 10a  . carvedilol (COREG) 3.125 mg, Oral, 2 times daily with meals  . Evolocumab (REPATHA) 140 MG/ML SOSY 1 pen, Subcutaneous, Every 14 days  . losartan-hydrochlorothiazide (HYZAAR) 100-12.5 MG tablet 1  tablet, Oral, Daily  . naproxen (NAPROSYN) 375 mg, Oral, 2 times daily with meals  . nitroGLYCERIN (NITROSTAT) 0.4 mg, Sublingual, Every 5 min PRN  . pantoprazole (PROTONIX) 40 mg, Oral, 2 times daily before meals   Radiology:  No results found.  Cardiac Studies:   Nuclear stress test  Exercise sestamibi stress 07/27/13: 1. Resting EKG showed normal sinus rhythm. Stress EKG was positive for ischemia. There was 2.5 mm additional ST depression noted in the inferior and anterior leads at peak exercise,  which resolved at > 3 minutes into recovery. Patient exercised on BRUCE PROTOCOL for 5 minutes 42 seconds. The maximum work level achieved was 7.3 MET's. The test was terminated due to fatigue, chest pain and achievement of the target heart rate. 2. Perfusion images reveal reversible anterior and anteroapical ischemia. Dynamic gated images reveal the left ventricular ejection fraction to be moderate to severely depressed and was estimated to be 37%. 3. This is a high risk study. Consider further cardiac work-up including cardiac catheterization.  Echocardiogram 07/30/13:  1. Left ventricular cavity is normal in size. Moderate concentric hypertrophy. Normal diastolic filling. Normal global wall motion. Normal systolic function. Calculated EF 61%.  Coronary angiogram 07/31/13:  Overlapping 4.0 x 55m, & 3.5 x 310mPromos Premier DES in prox. & mid LAD. Other arteries did not have any significant lesions. Normal LV gram, EF- 55%.  Assessment     ICD-10-CM   1. Atherosclerosis of native coronary artery of native heart with stable angina pectoris ([place] I25.118   2. Essential hypertension  I10   3. Pure hypercholesterolemia  E78.00 Evolocumab (REPATHA) 140 MG/ML SOSY  4. Hyperglycemia  R73.9     EKG 09/07/2019: Normal sinus rhythm at rate of 72 bpm, normal axis.  No evidence of ischemia, normal EKG.   Meds ordered this encounter  Medications  . Evolocumab (REPATHA) 140 MG/ML SOSY    Sig: Inject  1 pen into the skin every 14 (fourteen) days.    Dispense:  6 mL    Refill:  3    Medications Discontinued During This Encounter  Medication Reason  . bacitracin ointment Error  . cyclobenzaprine (FLEXERIL) 10 MG tablet Error  . HYDROcodone-acetaminophen (NORCO/VICODIN) 5-325 MG tablet Error  . MENTHOL-METHYL SALICYLATE EX Error     Recommendations:   GiLINNIE MCGLOCKLINis a [age].o. African-American male who is a [employer] and has PTSD,  coronary artery disease status post proximal to mid LAD PCI 2014, history of duodenal ulcer secondary to H. pylori status post triple therapy treatment in 2017 with no recurrence, enrolled in Espirion trial for hyperlipidemia due to statin intolerance, hypertension, chronically elevated CK enzymes, anxiety and depression and posttraumatic stress disorder, who is a veteran presents for annual visit.  Patient is here on 6 week  Visit after lab draw. No angina. BP controlled. He has had GI bleed in the past about 2 years ago and was anemic after he was on Plavix for long-term.  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  Hb is stable.  Previously he was on statin intolerant trial with Bempidoic acid but dropped off, now on low dose crestor due to statin intolerance and CK leak. He should be on Repatha in view of marked hyperlipidemia and also CAD. Will do prior authorization.   I will see him back in 6 weeks. Will recheck lipids prior to his visit.   JaAdrian ProwsMD, FAEncompass Health Rehabilitation Hospital Of Alexandria/06/2020, 2:34 PM PiUpper Montclairardiovascular. PAMackeyffice: [phone]

## 2019-10-22 ENCOUNTER — Other Ambulatory Visit: Payer: Self-pay

## 2019-10-22 ENCOUNTER — Encounter: Payer: Self-pay | Admitting: Cardiology

## 2019-10-22 ENCOUNTER — Ambulatory Visit: Payer: Federal, State, Local not specified - PPO | Admitting: Cardiology

## 2019-10-22 VITALS — BP 122/80 | HR 67 | Temp 98.0°F | Ht 68.0 in | Wt 240.0 lb

## 2019-10-22 DIAGNOSIS — E7800 Pure hypercholesterolemia, unspecified: Secondary | ICD-10-CM | POA: Diagnosis not present

## 2019-10-22 DIAGNOSIS — I10 Essential (primary) hypertension: Secondary | ICD-10-CM | POA: Diagnosis not present

## 2019-10-22 DIAGNOSIS — R739 Hyperglycemia, unspecified: Secondary | ICD-10-CM | POA: Diagnosis not present

## 2019-10-22 DIAGNOSIS — I25118 Atherosclerotic heart disease of native coronary artery with other forms of angina pectoris: Secondary | ICD-10-CM | POA: Diagnosis not present

## 2019-11-01 MED ORDER — REPATHA 140 MG/ML ~~LOC~~ SOSY: 1.0000 "pen " | PREFILLED_SYRINGE | SUBCUTANEOUS | 3 refills | Status: DC

## 2019-11-02 DIAGNOSIS — L820 Inflamed seborrheic keratosis: Secondary | ICD-10-CM | POA: Diagnosis not present

## 2019-11-02 DIAGNOSIS — L731 Pseudofolliculitis barbae: Secondary | ICD-10-CM | POA: Diagnosis not present

## 2019-11-06 DIAGNOSIS — G4730 Sleep apnea, unspecified: Secondary | ICD-10-CM | POA: Diagnosis not present

## 2019-11-06 DIAGNOSIS — H401113 Primary open-angle glaucoma, right eye, severe stage: Secondary | ICD-10-CM | POA: Diagnosis not present

## 2019-11-06 DIAGNOSIS — H401122 Primary open-angle glaucoma, left eye, moderate stage: Secondary | ICD-10-CM | POA: Diagnosis not present

## 2019-11-23 DIAGNOSIS — G4733 Obstructive sleep apnea (adult) (pediatric): Secondary | ICD-10-CM | POA: Diagnosis not present

## 2019-12-04 DIAGNOSIS — G4733 Obstructive sleep apnea (adult) (pediatric): Secondary | ICD-10-CM | POA: Diagnosis not present

## 2019-12-07 DIAGNOSIS — E876 Hypokalemia: Secondary | ICD-10-CM | POA: Diagnosis not present

## 2019-12-07 DIAGNOSIS — I10 Essential (primary) hypertension: Secondary | ICD-10-CM | POA: Diagnosis not present

## 2019-12-07 DIAGNOSIS — E785 Hyperlipidemia, unspecified: Secondary | ICD-10-CM | POA: Diagnosis not present

## 2019-12-07 DIAGNOSIS — I2510 Atherosclerotic heart disease of native coronary artery without angina pectoris: Secondary | ICD-10-CM | POA: Diagnosis not present

## 2019-12-10 ENCOUNTER — Ambulatory Visit: Payer: Federal, State, Local not specified - PPO | Admitting: Cardiology

## 2019-12-10 ENCOUNTER — Encounter: Payer: Self-pay | Admitting: Cardiology

## 2019-12-10 ENCOUNTER — Other Ambulatory Visit: Payer: Self-pay

## 2019-12-10 VITALS — BP 121/72 | HR 69 | Temp 98.2°F | Resp 14 | Ht 68.0 in | Wt 235.0 lb

## 2019-12-10 DIAGNOSIS — E7800 Pure hypercholesterolemia, unspecified: Secondary | ICD-10-CM

## 2019-12-10 DIAGNOSIS — I25118 Atherosclerotic heart disease of native coronary artery with other forms of angina pectoris: Secondary | ICD-10-CM

## 2019-12-10 DIAGNOSIS — I10 Essential (primary) hypertension: Secondary | ICD-10-CM

## 2019-12-10 MED ORDER — REPATHA 140 MG/ML ~~LOC~~ SOSY
1.0000 | PREFILLED_SYRINGE | SUBCUTANEOUS | 3 refills | Status: DC
Start: 2019-12-10 — End: 2020-08-28

## 2019-12-10 NOTE — Progress Notes (Signed)
Primary Physician/Referring:  [facility]  Patient ID: Sean Strong, male    DOB: Nov 02, 1965, 54 y.o.   MRN: [id number]  Chief Complaint  Patient presents with  . Coronary Artery Disease  . Hyperlipidemia  . Follow-up    6 week   HPI:    Sean Strong  is a 54 y.o. African-American male with coronary artery disease status post proximal to mid LAD PCI 2014, history of duodenal ulcer secondary to H. pylori status post triple therapy treatment in 2017 with no recurrence, enrolled in Espirion trial for hyperlipidemia due to statin intolerance, hypertension, chronically elevated CK enzymes, anxiety and depression and posttraumatic stress disorder, who is a veteran presents for 6 week visit for CAD and hyperlipidemia.  His [facility] and they have agreed to [facility] his medications.  He has not heard back regarding RepathaI had seen him 6 weeks ago, he had had a fall and hairline fracture of his clavicle.  I obtained labs and he now presents for follow-up.  Presently doing well and denies angina, dyspnea, leg edema.  Past Medical History:  Diagnosis Date  . Angina pectoris ([place])   . Anxiety   . Anxiety   . Arthralgia of both lower legs   . Coronary artery disease   . Depression   . Hyperlipidemia   . Hypertension   . PTSD (post-traumatic stress disorder)    Past Surgical History:  Procedure Laterality Date  . CARDIAC CATHETERIZATION  07/31/2013  . COSMETIC SURGERY    . ESOPHAGOGASTRODUODENOSCOPY (EGD) WITH PROPOFOL N/A 04/27/2016   Procedure: ESOPHAGOGASTRODUODENOSCOPY (EGD) WITH PROPOFOL;  Surgeon: Teena Irani, MD;  Location: [facility];  Service: Endoscopy;  Laterality: N/A;  . LEFT HEART CATHETERIZATION WITH CORONARY ANGIOGRAM N/A 07/31/2013   Procedure: LEFT HEART CATHETERIZATION WITH CORONARY ANGIOGRAM;  Surgeon: Laverda Page, MD;  Location: [facility] CATH LAB;  Service: Cardiovascular;  Laterality: N/A;  . PERCUTANEOUS CORONARY STENT  INTERVENTION (PCI-S)  07/31/2013   MID & PROXIMAL LAD       DR Einar Gip  . PERCUTANEOUS CORONARY STENT INTERVENTION (PCI-S)  07/31/2013   Procedure: PERCUTANEOUS CORONARY STENT INTERVENTION (PCI-S);  Surgeon: Laverda Page, MD;  Location: [facility] CATH LAB;  Service: Cardiovascular;;   Social History   Tobacco Use  . Smoking status: Former Smoker    Packs/day: 1.00    Years: 10.00    Pack years: 10.00    Types: Cigarettes    Quit date: 07/23/2012    Years since quitting: 7.3  . Smokeless tobacco: Never Used  Substance Use Topics  . Alcohol use: No    ROS  Review of Systems  Cardiovascular: Negative for chest pain, dyspnea on exertion and leg swelling.  Musculoskeletal: Positive for joint pain.  Gastrointestinal: Negative for melena.  Genitourinary: Positive for decreased libido.  Psychiatric/Behavioral: The patient is nervous/anxious.    Objective  Blood pressure 121/72, pulse 69, temperature 98.2 F (36.8 C), temperature source Temporal, resp. rate 14, height '5\' 8"'  (1.727 m), weight 235 lb (106.6 kg), SpO2 96 %.  Vitals with BMI 12/10/2019 10/22/2019 09/07/2019  Height '5\' 8"'  '5\' 8"'  '5\' 9"'   Weight 235 lbs 240 lbs 239 lbs  BMI 35.74 50.0 37.04  Systolic 888 916 945  Diastolic 72 80 78  Pulse 69 67 82     Physical Exam  Constitutional:  He is well-built and mildly obese in no acute distress.  Neck: No thyromegaly present.  Cardiovascular: Normal rate, regular rhythm, normal  heart sounds and intact distal pulses. Exam reveals no gallop.  No murmur heard. No leg edema, no JVD.  Pulmonary/Chest: Effort normal and breath sounds normal.  Abdominal: Soft. Bowel sounds are normal.  Musculoskeletal:     Cervical back: Neck supple.  Skin: Skin is warm and dry.   Laboratory examination:   Recent Labs    09/28/19 0812  NA 137  K 3.1*  CL 99  CO2 23  GLUCOSE 99  BUN 16  CREATININE 1.17  CALCIUM 9.2  GFRNONAA 70  GFRAA 81   CrCl cannot be calculated (Patient's most recent lab  result is older than the maximum 21 days allowed.).  CMP Latest Ref Rng & Units 09/28/2019 04/30/2016 04/29/2016  Glucose 65 - 99 mg/dL 99 117(H) 106(H)  BUN 6 - 24 mg/dL '16 11 9  ' Creatinine 0.76 - 1.27 mg/dL 1.17 1.18 1.10  Sodium 134 - 144 mmol/L 137 139 139  Potassium 3.5 - 5.2 mmol/L 3.1(L) 3.7 3.8  Chloride 96 - 106 mmol/L 99 110 111  CO2 20 - 29 mmol/L '23 23 22  ' Calcium 8.7 - 10.2 mg/dL 9.2 8.1(L) 8.1(L)  Total Protein 6.0 - 8.5 g/dL 7.3 - -  Total Bilirubin 0.0 - 1.2 mg/dL 0.8 - -  Alkaline Phos 39 - 117 IU/L 61 - -  AST 0 - 40 IU/L 16 - -  ALT 0 - 44 IU/L 11 - -   CBC Latest Ref Rng & Units 09/28/2019 05/01/2016 04/30/2016  WBC 3.4 - 10.8 x10E3/uL 5.6 7.8 6.6  Hemoglobin 13.0 - 17.7 g/dL 13.7 10.3(L) 9.4(L)  Hematocrit 37.5 - 51.0 % 41.0 31.7(L) 29.1(L)  Platelets 150 - 450 x10E3/uL 207 160 144(L)   Lipid Panel     Component Value Date/Time   CHOL 227 (H) 09/28/2019 0812   TRIG 109 09/28/2019 0812   HDL 39 (L) 09/28/2019 0812   CHOLHDL 6.8 07/24/2013 1025   VLDL 29 07/24/2013 1025   LDLCALC 168 (H) 09/28/2019 0812   LDLDIRECT 169 (H) 09/28/2019 0812   HEMOGLOBIN A1C Lab Results  Component Value Date   HGBA1C 6.3 (H) 09/28/2019   TSH Recent Labs    09/28/19 0812  TSH 1.390    Labs 01/19/2018: HB 12.8/HCT 41.2, platelets 230, normal indicis. BUN 14, creatinine 1.09, serum glucose 84 mg, eGFR greater than 60 ML. LFTs normal. Total CPK 313, uric acid 8.4 mildly elevated.  Lipids not performed as patient is in a trial compared to 07/12/17, CPK reduced from 405 and stable from May 2018 where it was 321.  Medications and allergies   Allergies  Allergen Reactions  . Crestor [Rosuvastatin Calcium] Other (See Comments)    Arthralgia  . Lipitor [Atorvastatin Calcium] Other (See Comments)    Myalgia    Current Outpatient Medications  Medication Instructions  . amLODipine (NORVASC) 5 mg, Oral, Daily  . aspirin 81 mg, Oral,  Every morning - 10a  . carvedilol (COREG) 3.125  mg, Oral, 2 times daily with meals  . Evolocumab (REPATHA) 140 MG/ML SOSY 1 pen, Subcutaneous, Every 14 days  . losartan-hydrochlorothiazide (HYZAAR) 100-12.5 MG tablet 1 tablet, Oral, Daily  . nitroGLYCERIN (NITROSTAT) 0.4 mg, Sublingual, Every 5 min PRN  . pantoprazole (PROTONIX) 40 mg, Oral, 2 times daily before meals   Radiology:  No results found.  Cardiac Studies:   Nuclear stress test  Exercise sestamibi stress 07/27/13: 1. Resting EKG showed normal sinus rhythm. Stress EKG was positive for ischemia. There was 2.5 mm additional ST depression  noted in the inferior and anterior leads at peak exercise, which resolved at > 3 minutes into recovery. Patient exercised on BRUCE PROTOCOL for 5 minutes 42 seconds. The maximum work level achieved was 7.3 MET's. The test was terminated due to fatigue, chest pain and achievement of the target heart rate. 2. Perfusion images reveal reversible anterior and anteroapical ischemia. Dynamic gated images reveal the left ventricular ejection fraction to be moderate to severely depressed and was estimated to be 37%. 3. This is a high risk study. Consider further cardiac work-up including cardiac catheterization.  Echocardiogram 07/30/13:  1. Left ventricular cavity is normal in size. Moderate concentric hypertrophy. Normal diastolic filling. Normal global wall motion. Normal systolic function. Calculated EF 61%.  Coronary angiogram 07/31/13:  Overlapping 4.0 x 24m, & 3.5 x 369mPromos Premier DES in prox. & mid LAD. Other arteries did not have any significant lesions. Normal LV gram, EF- 55%.  EKG:  EKG 09/07/2019: Normal sinus rhythm at rate of 72 bpm, normal axis.  No evidence of ischemia, normal EKG.   Assessment     ICD-10-CM   1. Pure hypercholesterolemia  E78.00 Evolocumab (REPATHA) 140 MG/ML SOSY  2. Atherosclerosis of native coronary artery of native heart with stable angina pectoris ([place] I25.118   3. Essential hypertension  I10     Meds  ordered this encounter  Medications  . Evolocumab (REPATHA) 140 MG/ML SOSY    Sig: Inject 1 pen into the skin every 14 (fourteen) days.    Dispense:  6 mL    Refill:  3    Medications Discontinued During This Encounter  Medication Reason  . naproxen (NAPROSYN) 375 MG tablet Patient Preference  . Evolocumab (REPATHA) 140 MG/ML SOSY Reorder     Recommendations:   GiSHIQUAN MATHIEUis a [age].o. African-American male who is a [employer] and has PTSD,  coronary artery disease status post proximal to mid LAD PCI 2014, history of duodenal ulcer secondary to H. pylori status post triple therapy treatment in 2017 with no recurrence, enrolled in Espirion trial for hyperlipidemia due to statin intolerance, hypertension, chronically elevated CK enzymes, anxiety and depression and posttraumatic stress disorder, who is a veteran presents for 6 week visit to evaluate hyperlipidemia and for medication compliance.    He is asymptomatic.  He has not go Repatha yet and it [place].  He does have severe myalgia and elevated CK enzymes with Statins.  Previously he was on statin intolerant trial with Bempidoic acid but dropped off, now on low dose crestor due to statin intolerance and CK leak.   He should be on Repatha in view of marked hyperlipidemia and also CAD. Will do prior authorization.  I have sent in a prescription for Repatha, discount card also given to the patient and if he needs a [facility] to be [facility], I will be happy to do so.  Patient will inform me if he is able to get Repatha at a discounted rate if not he will let me know.  Once he is on stable dose of Repatha, I will repeat his lipids. I will see him back in 6 weeks.     JaAdrian ProwsMD, FAChristus Mother Frances [facility] - [facility]/19/2021, 11[place]ardiovascular. PACape Charlesffice: [phone]

## 2020-01-18 NOTE — Progress Notes (Deleted)
Primary Physician/Referring:  [facility]  Patient ID: Sean Strong, male    DOB: [birth year], 54 y.o.   MRN: [id number]  No chief complaint on file.  HPI:    Sean Strong  is a 54 y.o. African-American male with coronary artery disease status post proximal to mid LAD PCI 2014, history of duodenal ulcer secondary to H. pylori status post triple therapy treatment in 2017 with no recurrence, enrolled in [place]ion trial for hyperlipidemia due to statin intolerance, hypertension, chronically elevated CK enzymes, anxiety and depression and posttraumatic stress disorder, who is a veteran presents for 6 week visit for CAD and hyperlipidemia.  ***  [facility] and [facility].  He has not heard back regarding RepathaI had seen him 6 weeks ago, he had had a fall and hairline fracture of his clavicle.  I obtained labs and he now presents for follow-up.  Presently doing well and denies angina, dyspnea, leg edema.  Past Medical History:  Diagnosis Date  . Angina pectoris ([place])   . Anxiety   . Anxiety   . Arthralgia of both lower legs   . Coronary artery disease   . Depression   . Hyperlipidemia   . Hypertension   . PTSD (post-traumatic stress disorder)    Past Surgical History:  Procedure Laterality Date  . CARDIAC CATHETERIZATION  07/31/2013  . COSMETIC SURGERY    . ESOPHAGOGASTRODUODENOSCOPY (EGD) WITH PROPOFOL N/A 04/27/2016   Procedure: ESOPHAGOGASTRODUODENOSCOPY (EGD) WITH PROPOFOL;  Surgeon: Teena Irani, MD;  Location: [facility];  Service: Endoscopy;  Laterality: N/A;  . LEFT HEART CATHETERIZATION WITH CORONARY ANGIOGRAM N/A 07/31/2013   Procedure: LEFT HEART CATHETERIZATION WITH CORONARY ANGIOGRAM;  Surgeon: Laverda Page, MD;  Location: [facility] CATH LAB;  Service: Cardiovascular;  Laterality: N/A;  . PERCUTANEOUS CORONARY STENT INTERVENTION (PCI-S)  07/31/2013   MID & PROXIMAL LAD       DR Einar Gip  .  PERCUTANEOUS CORONARY STENT INTERVENTION (PCI-S)  07/31/2013   Procedure: PERCUTANEOUS CORONARY STENT INTERVENTION (PCI-S);  Surgeon: Laverda Page, MD;  Location: [facility] CATH LAB;  Service: Cardiovascular;;   Family History  Problem Relation Age of Onset  . Diabetes Mother   . Heart disease Mother   . Kidney failure Mother   . Heart disease Sister   . Heart murmur Sister    Social History   Tobacco Use  . Smoking status: Former Smoker    Packs/day: 1.00    Years: 10.00    Pack years: 10.00    Types: Cigarettes    Quit date: 07/23/2012    Years since quitting: 7.4  . Smokeless tobacco: Never Used  Substance Use Topics  . Alcohol use: No   Marital Status: Divorced ROS  Review of Systems  Cardiovascular: Negative for chest pain, dyspnea on exertion and leg swelling.  Musculoskeletal: Positive for joint pain.  Gastrointestinal: Negative for melena.  Genitourinary: Positive for decreased libido.  Psychiatric/Behavioral: The patient is nervous/anxious.    Objective  There were no vitals taken for this visit.  Vitals with BMI 12/10/2019 10/22/2019 09/07/2019  Height _0  _1  _2   Weight 235 lbs 240 lbs 239 lbs  BMI 35.74 95.2 84.13  Systolic 244 010 272  Diastolic 72 80 78  Pulse 69 67 82     Physical Exam  Constitutional:  He is well-built and mildly obese in no acute distress.  Neck: No thyromegaly present.  Cardiovascular: Normal  rate, regular rhythm, normal heart sounds and intact distal pulses. Exam reveals no gallop.  No murmur heard. No leg edema, no JVD.  Pulmonary/Chest: Effort normal and breath sounds normal.  Abdominal: Soft. Bowel sounds are normal.  Musculoskeletal:     Cervical back: Neck supple.  Skin: Skin is warm and dry.   Laboratory examination:   Recent Labs    09/28/19 0812  NA 137  K 3.1*  CL 99  CO2 23  GLUCOSE 99  BUN 16  CREATININE 1.17  CALCIUM 9.2  GFRNONAA 70  GFRAA 81   CrCl cannot be calculated (Patient's most recent lab  result is older than the maximum 21 days allowed.).  CMP Latest Ref Rng & Units 09/28/2019 04/30/2016 04/29/2016  Glucose 65 - 99 mg/dL 99 117(H) 106(H)  BUN 6 - 24 mg/dL _0 Creatinine 0.76 - 1.27 mg/dL 1.17 1.18 1.10  Sodium 134 - 144 mmol/L 137 139 139  Potassium 3.5 - 5.2 mmol/L 3.1(L) 3.7 3.8  Chloride 96 - 106 mmol/L 99 110 111  CO2 20 - 29 mmol/L _1 Calcium 8.7 - 10.2 mg/dL 9.2 8.1(L) 8.1(L)  Total Protein 6.0 - 8.5 g/dL 7.3 - -  Total Bilirubin 0.0 - 1.2 mg/dL 0.8 - -  Alkaline Phos 39 - 117 IU/L 61 - -  AST 0 - 40 IU/L 16 - -  ALT 0 - 44 IU/L 11 - -   CBC Latest Ref Rng & Units 09/28/2019 05/01/2016 04/30/2016  WBC 3.4 - 10.8 x10E3/uL 5.6 7.8 6.6  Hemoglobin 13.0 - 17.7 g/dL 13.7 10.3(L) 9.4(L)  Hematocrit 37.5 - 51.0 % 41.0 31.7(L) 29.1(L)  Platelets 150 - 450 x10E3/uL 207 160 144(L)   Lipid Panel     Component Value Date/Time   CHOL 227 (H) 09/28/2019 0812   TRIG 109 09/28/2019 0812   HDL 39 (L) 09/28/2019 0812   CHOLHDL 6.8 07/24/2013 1025   VLDL 29 07/24/2013 1025   LDLCALC 168 (H) 09/28/2019 0812   LDLDIRECT 169 (H) 09/28/2019 0812   HEMOGLOBIN A1C Lab Results  Component Value Date   HGBA1C 6.3 (H) 09/28/2019   TSH Recent Labs    09/28/19 0812  TSH 1.390    External Labs: ***  01/19/2018: HB 12.8/HCT 41.2, platelets 230, normal indicis. BUN 14, creatinine 1.09, serum glucose 84 mg, eGFR greater than 60 ML. LFTs normal. Total CPK 313, uric acid 8.4 mildly elevated.  Lipids not performed as patient is in a trial compared to 07/12/17, CPK reduced from 405 and stable from May 2018 where it was 321.  Medications and allergies   Allergies  Allergen Reactions  . Crestor [Rosuvastatin Calcium] Other (See Comments)    Arthralgia  . Lipitor [Atorvastatin Calcium] Other (See Comments)    Myalgia    Current Outpatient Medications  Medication Instructions  . amLODipine (NORVASC) 5 mg, Oral, Daily  . aspirin 81 mg, Oral,  Every morning - 10a  .  carvedilol (COREG) 3.125 mg, Oral, 2 times daily with meals  . Evolocumab (REPATHA) 140 MG/ML SOSY 1 pen, Subcutaneous, Every 14 days  . losartan-hydrochlorothiazide (HYZAAR) 100-12.5 MG tablet 1 tablet, Oral, Daily  . nitroGLYCERIN (NITROSTAT) 0.4 mg, Sublingual, Every 5 min PRN  . pantoprazole (PROTONIX) 40 mg, Oral, 2 times daily before meals   Radiology:  No results found.  Cardiac Studies:   Nuclear stress test  Exercise sestamibi stress 07/27/13: 1. Resting EKG showed normal sinus rhythm. Stress EKG was positive for ischemia.  There was 2.5 mm additional ST depression noted in the inferior and anterior leads at peak exercise, which resolved at > 3 minutes into recovery. Patient exercised on BRUCE PROTOCOL for 5 minutes 42 seconds. The maximum work level achieved was 7.3 MET's. The test was terminated due to fatigue, chest pain and achievement of the target heart rate. 2. Perfusion images reveal reversible anterior and anteroapical ischemia. Dynamic gated images reveal the left ventricular ejection fraction to be moderate to severely depressed and was estimated to be 37%. 3. This is a high risk study. Consider further cardiac work-up including cardiac catheterization.  Echocardiogram 07/30/13:  1. Left ventricular cavity is normal in size. Moderate concentric hypertrophy. Normal diastolic filling. Normal global wall motion. Normal systolic function. Calculated EF 61%.  Coronary angiogram 07/31/13:  Overlapping 4.0 x 51m, & 3.5 x 364mPromos Premier DES in prox. & mid LAD. Other arteries did not have any significant lesions. Normal LV gram, EF- 55%.  EKG:  ***  09/07/2019: Normal sinus rhythm at rate of 72 bpm, normal axis.  No evidence of ischemia, normal EKG.   Assessment   No diagnosis found.   No orders of the defined types were placed in this encounter.   There are no discontinued medications.   Recommendations:   Sean BELINSKYis a [age].o. African-American male who  is a [place] and has PTSD,  coronary artery disease status post proximal to mid LAD PCI 2014, history of duodenal ulcer secondary to H. pylori status post triple therapy treatment in 2017 with no recurrence, enrolled in Espirion trial for hyperlipidemia due to statin intolerance, hypertension, chronically elevated CK enzymes, anxiety and depression and posttraumatic stress disorder, who is a veteran presents for 6 week visit to evaluate hyperlipidemia and for medication compliance.    He is asymptomatic.  He has not go Repatha yet and [place] [facility] [place].  He does have severe myalgia and elevated CK enzymes with Statins.  Previously he was on statin intolerant trial with Bempidoic acid but dropped off, now on low dose crestor due to statin intolerance and CK leak.   He should be on Repatha in view of marked hyperlipidemia and also CAD. Will do prior authorization.  I have sent in a prescription for Repatha, discount card also given to the patient and if he [facility], I will be happy to do so.  Patient will inform me if he is able to get Repatha at a discounted rate if not he will let me know.  Once he is on stable dose of Repatha, I will repeat his lipids. I will see him back in 6 weeks.    Sean ProwsMD, [facility]/28/2021, 2:12 PM [facility] Pager: [phone] Office: [phone]

## 2020-01-23 ENCOUNTER — Ambulatory Visit: Payer: Federal, State, Local not specified - PPO | Admitting: Cardiology

## 2020-02-05 DIAGNOSIS — H401122 Primary open-angle glaucoma, left eye, moderate stage: Secondary | ICD-10-CM | POA: Diagnosis not present

## 2020-02-05 DIAGNOSIS — H401113 Primary open-angle glaucoma, right eye, severe stage: Secondary | ICD-10-CM | POA: Diagnosis not present

## 2020-03-25 DIAGNOSIS — H401122 Primary open-angle glaucoma, left eye, moderate stage: Secondary | ICD-10-CM | POA: Diagnosis not present

## 2020-03-25 DIAGNOSIS — H401113 Primary open-angle glaucoma, right eye, severe stage: Secondary | ICD-10-CM | POA: Diagnosis not present

## 2020-05-19 DIAGNOSIS — F4310 Post-traumatic stress disorder, unspecified: Secondary | ICD-10-CM | POA: Diagnosis not present

## 2020-05-19 DIAGNOSIS — I10 Essential (primary) hypertension: Secondary | ICD-10-CM | POA: Diagnosis not present

## 2020-05-19 DIAGNOSIS — E785 Hyperlipidemia, unspecified: Secondary | ICD-10-CM | POA: Diagnosis not present

## 2020-05-19 DIAGNOSIS — F329 Major depressive disorder, single episode, unspecified: Secondary | ICD-10-CM | POA: Diagnosis not present

## 2020-05-19 DIAGNOSIS — Z0189 Encounter for other specified special examinations: Secondary | ICD-10-CM | POA: Diagnosis not present

## 2020-05-19 DIAGNOSIS — I2510 Atherosclerotic heart disease of native coronary artery without angina pectoris: Secondary | ICD-10-CM | POA: Diagnosis not present

## 2020-06-16 DIAGNOSIS — R519 Headache, unspecified: Secondary | ICD-10-CM | POA: Diagnosis not present

## 2020-06-16 DIAGNOSIS — M542 Cervicalgia: Secondary | ICD-10-CM | POA: Diagnosis not present

## 2020-06-16 DIAGNOSIS — R0981 Nasal congestion: Secondary | ICD-10-CM | POA: Diagnosis not present

## 2020-06-16 DIAGNOSIS — Z20822 Contact with and (suspected) exposure to covid-19: Secondary | ICD-10-CM | POA: Diagnosis not present

## 2020-06-24 DIAGNOSIS — H401113 Primary open-angle glaucoma, right eye, severe stage: Secondary | ICD-10-CM | POA: Diagnosis not present

## 2020-06-24 DIAGNOSIS — H527 Unspecified disorder of refraction: Secondary | ICD-10-CM | POA: Diagnosis not present

## 2020-06-24 DIAGNOSIS — H401122 Primary open-angle glaucoma, left eye, moderate stage: Secondary | ICD-10-CM | POA: Diagnosis not present

## 2020-06-24 DIAGNOSIS — G4733 Obstructive sleep apnea (adult) (pediatric): Secondary | ICD-10-CM | POA: Diagnosis not present

## 2020-07-11 DIAGNOSIS — H527 Unspecified disorder of refraction: Secondary | ICD-10-CM | POA: Diagnosis not present

## 2020-07-11 DIAGNOSIS — H2513 Age-related nuclear cataract, bilateral: Secondary | ICD-10-CM | POA: Diagnosis not present

## 2020-07-25 DIAGNOSIS — R9431 Abnormal electrocardiogram [ECG] [EKG]: Secondary | ICD-10-CM | POA: Diagnosis not present

## 2020-07-25 DIAGNOSIS — I2510 Atherosclerotic heart disease of native coronary artery without angina pectoris: Secondary | ICD-10-CM | POA: Diagnosis not present

## 2020-07-25 DIAGNOSIS — E785 Hyperlipidemia, unspecified: Secondary | ICD-10-CM | POA: Diagnosis not present

## 2020-08-08 DIAGNOSIS — I340 Nonrheumatic mitral (valve) insufficiency: Secondary | ICD-10-CM | POA: Diagnosis not present

## 2020-08-08 DIAGNOSIS — I369 Nonrheumatic tricuspid valve disorder, unspecified: Secondary | ICD-10-CM | POA: Diagnosis not present

## 2020-08-20 ENCOUNTER — Telehealth: Payer: Self-pay

## 2020-08-20 NOTE — Telephone Encounter (Signed)
Patient is having sharp pain on his left side he had an Echo done at the [place] they told him it was normal patient is still having sharp pain he would like to be seen by you but whoever spoke to him told him you are fully booked till Feb patient at least wants to know if he can do something to help his symptoms pt stated he has his Nitro but his pain is still current please advise

## 2020-08-21 NOTE — Telephone Encounter (Signed)
Patient has been scheduled

## 2020-08-21 NOTE — Telephone Encounter (Signed)
If there is an urgent matter, you should discuss with one of my colleagues. Please add to one of our schedules, may be Park Meo can give a call to see if this is urgent

## 2020-08-26 DIAGNOSIS — H401113 Primary open-angle glaucoma, right eye, severe stage: Secondary | ICD-10-CM | POA: Diagnosis not present

## 2020-08-26 DIAGNOSIS — G4733 Obstructive sleep apnea (adult) (pediatric): Secondary | ICD-10-CM | POA: Diagnosis not present

## 2020-08-26 DIAGNOSIS — H2513 Age-related nuclear cataract, bilateral: Secondary | ICD-10-CM | POA: Diagnosis not present

## 2020-08-26 DIAGNOSIS — H401122 Primary open-angle glaucoma, left eye, moderate stage: Secondary | ICD-10-CM | POA: Diagnosis not present

## 2020-08-27 NOTE — Progress Notes (Signed)
Primary Physician/Referring:  [facility]  Patient ID: Sean Strong, male    DOB: [birth year], 55 y.o.   MRN: [id number]  Chief Complaint  Patient presents with   chest pain   HPI:    Sean Strong  is a 55 y.o. African-American male with coronary artery disease status post proximal to mid LAD PCI 2014, history of duodenal ulcer secondary to H. pylori status post triple therapy treatment in 2017 with no recurrence, hyperlipidemia with statin intolerance, hypertension, chronically elevated CK enzymes, anxiety and depression and posttraumatic stress disorder, who is a veteran and therefore also followed at the [place]. Patient was previously enrolled in [place] trial for hyperlipidemia, however he dropped off.   Patient was last seen in our office 12/10/2019 by Dr. Einar Gip who recommended follow-up in 6 weeks, patient now presents for urgent visit with complaints of left-sided pain.  At last visit patient was started on Repatha in view of statin intolerance.  Per patient he recently had an echocardiogram done at the [place] which she was told was normal.  Patient now complains of left-sided chest pain for the last 1 month which he describes as sharp.  This pain lasts less than 1 minute, is not exertional, with no identifiable triggers and no associated symptoms.  Patient does not exercise regularly, however he has no chest pain or dyspnea when walking up and down his stairs.  Patient denies shortness of breath, palpitations, dizziness, syncope, near syncope, leg swelling, orthopnea, PND.  Of note patient has been diagnosed with obstructive sleep apnea, but is noncompliant with CPAP.  He does not bring with him home blood pressure readings.  In the past patient has been prescribed Repatha, however he had difficulty getting this from the pharmacy and therefore was recently started on Zetia by his cardiologist at the [facility] but patient has not done so.  Past Medical History:  Diagnosis Date    Angina pectoris ([place])    Anxiety    Anxiety    Arthralgia of both lower legs    Coronary artery disease    Depression    Hyperlipidemia    Hypertension    PTSD (post-traumatic stress disorder)    Past Surgical History:  Procedure Laterality Date   CARDIAC CATHETERIZATION  07/31/2013   COSMETIC SURGERY     ESOPHAGOGASTRODUODENOSCOPY (EGD) WITH PROPOFOL N/A 04/27/2016   Procedure: ESOPHAGOGASTRODUODENOSCOPY (EGD) WITH PROPOFOL;  Surgeon: Teena Irani, MD;  Location: [place];  Service: Endoscopy;  Laterality: N/A;   LEFT HEART CATHETERIZATION WITH CORONARY ANGIOGRAM N/A 07/31/2013   Procedure: LEFT HEART CATHETERIZATION WITH CORONARY ANGIOGRAM;  Surgeon: Laverda Page, MD;  Location: [facility] CATH LAB;  Service: Cardiovascular;  Laterality: N/A;   PERCUTANEOUS CORONARY STENT INTERVENTION (PCI-S)  07/31/2013   MID & PROXIMAL LAD       DR Einar Gip   PERCUTANEOUS CORONARY STENT INTERVENTION (PCI-S)  07/31/2013   Procedure: PERCUTANEOUS CORONARY STENT INTERVENTION (PCI-S);  Surgeon: Laverda Page, MD;  Location: [facility] CATH LAB;  Service: Cardiovascular;;   Social History   Tobacco Use   Smoking status: Former Smoker    Packs/day: 1.00    Years: 10.00    Pack years: 10.00    Types: Cigarettes    Quit date: 07/23/2012    Years since quitting: 8.1   Smokeless tobacco: Never Used  Substance Use Topics   Alcohol use: No    ROS  Review of Systems  Constitutional: Negative for malaise/fatigue and weight gain.  Cardiovascular: Positive for  chest pain. Negative for claudication, dyspnea on exertion, leg swelling, near-syncope, orthopnea, palpitations, paroxysmal nocturnal dyspnea and syncope.  Respiratory: Negative for shortness of breath.   Hematologic/Lymphatic: Does not bruise/bleed easily.  Musculoskeletal: Positive for joint pain.  Gastrointestinal: Negative for melena.  Neurological: Negative for dizziness and weakness.  Psychiatric/Behavioral: The patient is  nervous/anxious.    Objective  Blood pressure (!) 150/90, pulse 77, resp. rate 16, height '5\' 8"'  (1.727 m), weight 235 lb (106.6 kg), SpO2 95 %.  Vitals with BMI 08/28/2020 12/10/2019 10/22/2019  Height '5\' 8"'  '5\' 8"'  '5\' 8"'   Weight 235 lbs 235 lbs 240 lbs  BMI 35.74 25.05 39.7  Systolic 673 419 379  Diastolic 90 72 80  Pulse 77 69 67     Physical Exam Vitals reviewed.  Constitutional:      Comments: He is well-built and mildly obese in no acute distress.  HENT:     Head: Normocephalic and atraumatic.  Neck:     Thyroid: No thyromegaly.  Cardiovascular:     Rate and Rhythm: Normal rate and regular rhythm.     Pulses: Intact distal pulses.          Carotid pulses are 2+ on the right side and 2+ on the left side.      Radial pulses are 2+ on the right side and 2+ on the left side.     Heart sounds: Normal heart sounds, S1 normal and S2 normal. No murmur heard. No gallop.      Comments: No leg edema, no JVD. Pulmonary:     Effort: Pulmonary effort is normal. No respiratory distress.     Breath sounds: Normal breath sounds. No wheezing, rhonchi or rales.  Abdominal:     General: Bowel sounds are normal.     Palpations: Abdomen is soft.  Musculoskeletal:     Cervical back: Neck supple.     Right lower leg: No edema.     Left lower leg: No edema.  Skin:    General: Skin is warm and dry.  Neurological:     Mental Status: He is alert.    Laboratory examination:   Recent Labs    09/28/19 0812  NA 137  K 3.1*  CL 99  CO2 23  GLUCOSE 99  BUN 16  CREATININE 1.17  CALCIUM 9.2  GFRNONAA 70  GFRAA 81   CrCl cannot be calculated (Patient's most recent lab result is older than the maximum 21 days allowed.).  CMP Latest Ref Rng & Units 09/28/2019 04/30/2016 04/29/2016  Glucose 65 - 99 mg/dL 99 117(H) 106(H)  BUN 6 - 24 mg/dL '16 11 9  ' Creatinine 0.76 - 1.27 mg/dL 1.17 1.18 1.10  Sodium 134 - 144 mmol/L 137 139 139  Potassium 3.5 - 5.2 mmol/L 3.1(L) 3.7 3.8  Chloride 96 - 106 mmol/L 99  110 111  CO2 20 - 29 mmol/L '23 23 22  ' Calcium 8.7 - 10.2 mg/dL 9.2 8.1(L) 8.1(L)  Total Protein 6.0 - 8.5 g/dL 7.3 - -  Total Bilirubin 0.0 - 1.2 mg/dL 0.8 - -  Alkaline Phos 39 - 117 IU/L 61 - -  AST 0 - 40 IU/L 16 - -  ALT 0 - 44 IU/L 11 - -   CBC Latest Ref Rng & Units 09/28/2019 05/01/2016 04/30/2016  WBC 3.4 - 10.8 x10E3/uL 5.6 7.8 6.6  Hemoglobin 13.0 - 17.7 g/dL 13.7 10.3(L) 9.4(L)  Hematocrit 37.5 - 51.0 % 41.0 31.7(L) 29.1(L)  Platelets 150 - 450 x10E3/uL  207 160 144(L)   Lipid Panel     Component Value Date/Time   CHOL 227 (H) 09/28/2019 0812   TRIG 109 09/28/2019 0812   HDL 39 (L) 09/28/2019 0812   CHOLHDL 6.8 07/24/2013 1025   VLDL 29 07/24/2013 1025   LDLCALC 168 (H) 09/28/2019 0812   LDLDIRECT 169 (H) 09/28/2019 0812   HEMOGLOBIN A1C Lab Results  Component Value Date   HGBA1C 6.3 (H) 09/28/2019   TSH Recent Labs    09/28/19 0812  TSH 1.390    Labs 01/19/2018: HB 12.8/HCT 41.2, platelets 230, normal indicis. BUN 14, creatinine 1.09, serum glucose 84 mg, eGFR greater than 60 ML. LFTs normal. Total CPK 313, uric acid 8.4 mildly elevated.  Lipids not performed as patient is in a trial compared to 07/12/17, CPK reduced from 405 and stable from May 2018 where it was 321.  Medications and allergies   Allergies  Allergen Reactions   Crestor [Rosuvastatin Calcium] Other (See Comments)    Arthralgia   Lipitor [Atorvastatin Calcium] Other (See Comments)    Myalgia    Current Outpatient Medications  Medication Instructions   amLODipine (NORVASC) 10 mg, Oral, Daily   aspirin 81 mg, Oral,  Every morning - 10a   carvedilol (COREG) 3.125 mg, Oral, 2 times daily with meals   Evolocumab (REPATHA) 140 MG/ML SOSY 1 pen, Subcutaneous, Every 14 days   losartan-hydrochlorothiazide (HYZAAR) 100-12.5 MG tablet 1 tablet, Oral, Daily   nitroGLYCERIN (NITROSTAT) 0.4 mg, Sublingual, Every 5 min PRN   pantoprazole (PROTONIX) 40 mg, Oral, 2 times daily before meals    Radiology:  No results found.  Cardiac Studies:   [facility]2020:  Left ventricular size, thickness and function. Ejection fraction greater than 55% by visual estimation. Left ventricular wall motion. Right ventricle is normal in size and function. Tissue Doppler sampling consistent with normal diastolic function. Doppler findings do not suggest pulmonary hypertension. No pericardial effusion. Essentially normal study.  Nuclear stress test  Exercise sestamibi stress 07/27/13: 1. Resting EKG showed normal sinus rhythm. Stress EKG was positive for ischemia. There was 2.5 mm additional ST depression noted in the inferior and anterior leads at peak exercise, which resolved at > 3 minutes into recovery. Patient exercised on BRUCE PROTOCOL for 5 minutes 42 seconds. The maximum work level achieved was 7.3 MET's. The test was terminated due to fatigue, chest pain and achievement of the target heart rate. 2. Perfusion images reveal reversible anterior and anteroapical ischemia. Dynamic gated images reveal the left ventricular ejection fraction to be moderate to severely depressed and was estimated to be 37%. 3. This is a high risk study. Consider further cardiac work-up including cardiac catheterization.  Echocardiogram 07/30/13:  1. Left ventricular cavity is normal in size. Moderate concentric hypertrophy. Normal diastolic filling. Normal global wall motion. Normal systolic function. Calculated EF 61%.  Coronary angiogram 07/31/13:  Overlapping 4.0 x 31m, & 3.5 x 375mPromos Premier DES in prox. & mid LAD. Other arteries did not have any significant lesions. Normal LV gram, EF- 55%.  EKG   EKG 08/28/2020: Normal sinus rhythm at a rate of 72 bpm.  Normal axis.  No evidence of ischemia or injury.  Compared to EKG 09/07/2019, no significant change.  Assessment     ICD-10-CM   1. Atherosclerosis of native coronary artery of native heart with  stable angina pectoris (HCC)  I25.118 PCV MYOCARDIAL PERFUSION WO LEXISCAN    Novel Coronavirus, NAA ([facility])  Evolocumab SOAJ 140 mg  2. Pure hypercholesterolemia  E78.00   3. Essential hypertension  I10   4. Chest pain of uncertain etiology  D98.3 EKG 12-Lead  5. Precordial pain  R07.2 PCV MYOCARDIAL PERFUSION WO LEXISCAN    Novel Coronavirus, NAA ([facility])    Meds ordered this encounter  Medications   amLODipine (NORVASC) 10 MG tablet    Sig: Take 1 tablet (10 mg total) by mouth daily.    Dispense:  30 tablet    Refill:  3   Evolocumab SOAJ 140 mg    Medications Discontinued During This Encounter  Medication Reason   amLODipine (NORVASC) 5 MG tablet Reorder   ezetimibe (ZETIA) 10 MG tablet Change in therapy    Repatha:  Given : 140 mg :  : Subcutaneous 08/28/20 1607 08/28/20 1609 Obenshine, Tara L, CMA Left Upper Abdomen     Recommendations:   Sean Strong  is a 55 y.o. African-American male with coronary artery disease status post proximal to mid LAD PCI 2014, history of duodenal ulcer secondary to H. pylori status post triple therapy treatment in 2017 with no recurrence, hyperlipidemia with statin intolerance, hypertension, chronically elevated CK enzymes, anxiety and depression and posttraumatic stress disorder, who is a veteran and therefore also followed at the [place]. Patient was previously enrolled in [place] trial for hyperlipidemia, however he dropped off.   Patient presents for office visit at his request for a second opinion in regard to left-sided chest pain patient has been experiencing for the last 1 month.  He was recently seen by cardiologist in the [place] who obtained echocardiogram which was normal with LVEF of 55%.  Patient's cardiologist at the [facility] counseled him that chest pain was not likely of cardiac etiology and recommended no further work-up.  Patient symptoms are atypical and EKG is unchanged from previous, more suggestive of musculoskeletal ideology.   However in view of patient's history of coronary artery disease and that he has had no ischemic work-up since 2014 I feel it would be reasonable at this time to repeat nuclear stress test.  Patient is agreeable to nuclear stress testing, therefore we'll schedule it in our office.    Patient also has a history of marked hyperlipidemia for which he was prescribed Repatha, due to statin intolerance, at his last visit in our office, however patient had difficulties obtaining medication.  Patient's cardiologist at the [facility] also prescribe Zetia, however patient has not taken it.  As patient has not been on medication for hyperlipidemia, this raises my suspicion of progression of coronary artery disease.  Will restart patient on Repatha at this time and complete prior authorizatoin, gave sample injection in the office today. Will recheck lipid panel in 3 months.   In regard to hypertension, patient's blood pressure remains elevated above goal of <130/80 mmHg.  We'll increase amlodipine from 5 mg to 10 mg daily.  I personally reviewed external records shared by the [place].   Follow-up in 8 weeks for hypertension, hyperlipidemia, precordial pain.   Alethia Berthold, PA-C 08/29/2020, 11:22 AM Office: [phone]

## 2020-08-28 ENCOUNTER — Ambulatory Visit: Payer: Federal, State, Local not specified - PPO | Admitting: Student

## 2020-08-28 ENCOUNTER — Other Ambulatory Visit: Payer: Self-pay

## 2020-08-28 ENCOUNTER — Encounter: Payer: Self-pay | Admitting: Student

## 2020-08-28 VITALS — BP 150/90 | HR 77 | Resp 16 | Ht 68.0 in | Wt 235.0 lb

## 2020-08-28 DIAGNOSIS — R079 Chest pain, unspecified: Secondary | ICD-10-CM

## 2020-08-28 DIAGNOSIS — I25118 Atherosclerotic heart disease of native coronary artery with other forms of angina pectoris: Secondary | ICD-10-CM | POA: Diagnosis not present

## 2020-08-28 DIAGNOSIS — R072 Precordial pain: Secondary | ICD-10-CM | POA: Diagnosis not present

## 2020-08-28 DIAGNOSIS — E7800 Pure hypercholesterolemia, unspecified: Secondary | ICD-10-CM

## 2020-08-28 DIAGNOSIS — I10 Essential (primary) hypertension: Secondary | ICD-10-CM | POA: Diagnosis not present

## 2020-08-28 MED ORDER — AMLODIPINE BESYLATE 10 MG PO TABS: 10.0000 mg | ORAL_TABLET | Freq: Every day | ORAL | 3 refills | Status: AC

## 2020-08-28 MED ORDER — EVOLOCUMAB 140 MG/ML ~~LOC~~ SOAJ
140.0000 mg | Freq: Once | SUBCUTANEOUS | Status: AC
  Administered 2020-08-28: 140 mg via SUBCUTANEOUS

## 2020-08-28 MED ORDER — REPATHA 140 MG/ML ~~LOC~~ SOSY: 1.0000 "pen " | PREFILLED_SYRINGE | SUBCUTANEOUS | 3 refills | Status: DC

## 2020-08-29 ENCOUNTER — Other Ambulatory Visit (HOSPITAL_COMMUNITY)
Admission: RE | Admit: 2020-08-29 | Discharge: 2020-08-29 | Disposition: A | Discharge status: Home / Self Care | Payer: Federal, State, Local not specified - PPO | Source: Ambulatory Visit | Attending: Student | Admitting: Student

## 2020-08-29 DIAGNOSIS — Z01812 Encounter for preprocedural laboratory examination: Secondary | ICD-10-CM | POA: Insufficient documentation

## 2020-08-29 DIAGNOSIS — Z20822 Contact with and (suspected) exposure to covid-19: Secondary | ICD-10-CM | POA: Diagnosis not present

## 2020-08-30 LAB — SARS CORONAVIRUS 2 (TAT 6-24 HRS): SARS Coronavirus 2: NEGATIVE

## 2020-09-01 ENCOUNTER — Other Ambulatory Visit: Payer: Self-pay

## 2020-09-01 ENCOUNTER — Ambulatory Visit: Payer: Federal, State, Local not specified - PPO

## 2020-09-01 DIAGNOSIS — I25118 Atherosclerotic heart disease of native coronary artery with other forms of angina pectoris: Secondary | ICD-10-CM

## 2020-09-01 DIAGNOSIS — R072 Precordial pain: Secondary | ICD-10-CM

## 2020-09-01 NOTE — Progress Notes (Signed)
Please notify patient his covid test was negative.

## 2020-09-02 NOTE — Progress Notes (Signed)
Called and spoke with patient regarding his NEG COVID test result.

## 2020-09-03 NOTE — Progress Notes (Signed)
Spoke to patient he is aware of results.

## 2020-09-03 NOTE — Progress Notes (Signed)
Please inform patient his stress test is low risk, no evidence ischemia to the heart muscle.

## 2020-09-18 ENCOUNTER — Ambulatory Visit: Payer: Federal, State, Local not specified - PPO | Admitting: Student

## 2020-09-18 ENCOUNTER — Other Ambulatory Visit: Payer: Self-pay

## 2020-09-18 DIAGNOSIS — I25118 Atherosclerotic heart disease of native coronary artery with other forms of angina pectoris: Secondary | ICD-10-CM

## 2020-09-18 DIAGNOSIS — E7800 Pure hypercholesterolemia, unspecified: Secondary | ICD-10-CM | POA: Diagnosis not present

## 2020-09-18 MED ORDER — EVOLOCUMAB 140 MG/ML ~~LOC~~ SOAJ: 140.0000 mg | Freq: Once | SUBCUTANEOUS | Status: DC

## 2020-09-18 MED ORDER — EVOLOCUMAB 140 MG/ML ~~LOC~~ SOAJ
140.0000 mg | Freq: Once | SUBCUTANEOUS | Status: AC
  Administered 2020-09-18: 140 mg via SUBCUTANEOUS

## 2020-09-19 NOTE — Progress Notes (Signed)
Meds ordered this encounter  Medications  . Evolocumab SOAJ 140 mg  . Evolocumab SOAJ 140 mg   Ordered Dose: 140 mg Route: Subcutaneous Frequency: Once  Admin Dose: --     Scheduled Start Date/Time: 09/18/20 1345 End Date/Time: 09/18/20 1343 after 1 doses       Diagnosis Association: Atherosclerosis of native coronary artery of native heart with stable angina pectoris (HCC) (I25.118)    Order Status: Completed Thu Sep 18, 2020 1343, originally scheduled to end   NF Ordered: Yes NF post-verification: No Non-Formulary Code: Acceptable to use formulary alternative  Ordering User: Charyl Bigger Ordering Date/Time: Thu Sep 18, 2020 1342  Ordering Provider: Rayford Halsted, PA-C Authorizing Provider: Rayford Halsted, PA-C   1. Pure hypercholesterolemia - Evolocumab SOAJ 140 mg  2. Atherosclerosis of native coronary artery of native heart with stable angina pectoris (HCC) - Evolocumab SOAJ 140 mg   Rayford Halsted, PA-C 09/19/2020, 8:49 AM Office: [phone]

## 2020-10-27 ENCOUNTER — Other Ambulatory Visit: Payer: Self-pay

## 2020-10-27 ENCOUNTER — Encounter: Payer: Self-pay | Admitting: Student

## 2020-10-27 ENCOUNTER — Ambulatory Visit: Payer: Federal, State, Local not specified - PPO | Admitting: Student

## 2020-10-27 VITALS — BP 155/96 | HR 73 | Temp 97.3°F | Resp 16 | Ht 68.0 in | Wt 254.0 lb

## 2020-10-27 DIAGNOSIS — R072 Precordial pain: Secondary | ICD-10-CM

## 2020-10-27 DIAGNOSIS — I25118 Atherosclerotic heart disease of native coronary artery with other forms of angina pectoris: Secondary | ICD-10-CM | POA: Diagnosis not present

## 2020-10-27 DIAGNOSIS — I10 Essential (primary) hypertension: Secondary | ICD-10-CM | POA: Diagnosis not present

## 2020-10-27 DIAGNOSIS — E7800 Pure hypercholesterolemia, unspecified: Secondary | ICD-10-CM | POA: Diagnosis not present

## 2020-10-27 NOTE — Progress Notes (Signed)
Primary Physician/Referring:  Clinic, [facility]  Patient ID: Sean Strong, male    DOB: 19-May-1966, 55 y.o.   MRN: [id number]  Chief Complaint  Patient presents with   Coronary Artery Disease   Follow-up    8 weeks   HPI:    Sean Strong  is a 55 y.o. African-American male with coronary artery disease status post proximal to mid LAD PCI 2014, history of duodenal ulcer secondary to H. pylori status post triple therapy treatment in 2017 with no recurrence, hyperlipidemia with statin intolerance, hypertension, chronically elevated CK enzymes, anxiety and depression and posttraumatic stress disorder, who is a veteran and therefore also followed at the [place]. Patient was previously enrolled in [place] trial for hyperlipidemia, however he dropped off.   Patient presents for follow-up of hypertension, hyperlipidemia, and precordial pain.  Last visit restarted patient on Repatha due to statin intolerance and increased amlodipine from 5 mg to 10 mg daily.  Since last visit patient has undergone low risk nuclear stress test.  Patient complains of worsening paresthesias in his hands bilaterally at night since restarting Repatha, which he attributes to the medication, therefore he has discontinued Repatha at this time.  [facility] Zetia which patient had not taken, however as he is no longer taking Repatha he plans to start Zetia.  Patient has history of statin intolerance.  Patient has had no recurrence of precordial pain since last visit.  Denies chest pain, palpitations, dizziness, syncope, near syncope, leg swelling, orthopnea, PND.  Patient does note intermittent blurry vision for which she follows with ophthalmology, presently considering cataract surgery.  Patient has appointment with ophthalmology tomorrow.  Patient also concerned today regarding right forearm pain, he denies injury to this area.  During medication reconciliation, it is clear  patient is not sure about which medications he is presently taking, therefore he will call our office to perform medication reconciliation when he gets home later today.  Past Medical History:  Diagnosis Date   Angina pectoris ([place])    Anxiety    Anxiety    Arthralgia of both lower legs    Coronary artery disease    Depression    Hyperlipidemia    Hypertension    PTSD (post-traumatic stress disorder)    Past Surgical History:  Procedure Laterality Date   CARDIAC CATHETERIZATION  07/31/2013   COSMETIC SURGERY     ESOPHAGOGASTRODUODENOSCOPY (EGD) WITH PROPOFOL N/A 04/27/2016   Procedure: ESOPHAGOGASTRODUODENOSCOPY (EGD) WITH PROPOFOL;  Surgeon: Teena Irani, MD;  Location: [facility];  Service: Endoscopy;  Laterality: N/A;   LEFT HEART CATHETERIZATION WITH CORONARY ANGIOGRAM N/A 07/31/2013   Procedure: LEFT HEART CATHETERIZATION WITH CORONARY ANGIOGRAM;  Surgeon: Laverda Page, MD;  Location: [facility] CATH LAB;  Service: Cardiovascular;  Laterality: N/A;   PERCUTANEOUS CORONARY STENT INTERVENTION (PCI-S)  07/31/2013   MID & PROXIMAL LAD       DR Einar Gip   PERCUTANEOUS CORONARY STENT INTERVENTION (PCI-S)  07/31/2013   Procedure: PERCUTANEOUS CORONARY STENT INTERVENTION (PCI-S);  Surgeon: Laverda Page, MD;  Location: [facility] CATH LAB;  Service: Cardiovascular;;   Social History   Tobacco Use   Smoking status: Former Smoker    Packs/day: 1.00    Years: 10.00    Pack years: 10.00    Types: Cigarettes    Quit date: 07/23/2012    Years since quitting: 8.2   Smokeless tobacco: Never Used  Substance Use Topics   Alcohol use: No    ROS  Review of Systems  Constitutional: Negative for malaise/fatigue and weight gain.  Eyes: Positive for blurred vision (intermittent, follows with ophthalmologist).  Cardiovascular: Negative for chest pain, claudication, dyspnea on exertion, leg swelling, near-syncope, orthopnea, palpitations, paroxysmal nocturnal dyspnea and syncope.   Respiratory: Negative for shortness of breath.   Hematologic/Lymphatic: Does not bruise/bleed easily.  Musculoskeletal: Positive for joint pain.       Right forearm pain  Gastrointestinal: Negative for melena.  Neurological: Positive for paresthesias (bilateral hands at night ). Negative for dizziness and weakness.  Psychiatric/Behavioral: The patient is nervous/anxious.    Objective  Blood pressure (!) 155/96, pulse 73, temperature (!) 97.3 F (36.3 C), resp. rate 16, height '5\' 8"'  (1.727 m), weight 254 lb (115.2 kg), SpO2 96 %.  Vitals with BMI 10/27/2020 10/27/2020 08/28/2020  Height - '5\' 8"'  '5\' 8"'   Weight - 254 lbs 235 lbs  BMI - 63.78 58.85  Systolic 027 741 287  Diastolic 96 867 90  Pulse 73 72 77     Physical Exam Vitals reviewed.  Constitutional:      Comments: He is well-built and mildly obese in no acute distress.  HENT:     Head: Normocephalic and atraumatic.  Neck:     Thyroid: No thyromegaly.  Cardiovascular:     Rate and Rhythm: Normal rate and regular rhythm.     Pulses: Intact distal pulses.          Carotid pulses are 2+ on the right side and 2+ on the left side.      Radial pulses are 2+ on the right side and 2+ on the left side.     Heart sounds: Normal heart sounds, S1 normal and S2 normal. No murmur heard. No gallop.      Comments: No leg edema, no JVD. Pulmonary:     Effort: Pulmonary effort is normal. No respiratory distress.     Breath sounds: Normal breath sounds. No wheezing, rhonchi or rales.  Abdominal:     General: Bowel sounds are normal.     Palpations: Abdomen is soft.  Musculoskeletal:     Cervical back: Neck supple.     Right lower leg: No edema.     Left lower leg: No edema.  Skin:    General: Skin is warm and dry.  Neurological:     Mental Status: He is alert.    Laboratory examination:   No results for input(s): NA, K, CL, CO2, GLUCOSE, BUN, CREATININE, CALCIUM, GFRNONAA, GFRAA in the last 8760 hours. CrCl cannot be calculated  (Patient's most recent lab result is older than the maximum 21 days allowed.).  CMP Latest Ref Rng & Units 09/28/2019 04/30/2016 04/29/2016  Glucose 65 - 99 mg/dL 99 117(H) 106(H)  BUN 6 - 24 mg/dL '16 11 9  ' Creatinine 0.76 - 1.27 mg/dL 1.17 1.18 1.10  Sodium 134 - 144 mmol/L 137 139 139  Potassium 3.5 - 5.2 mmol/L 3.1(L) 3.7 3.8  Chloride 96 - 106 mmol/L 99 110 111  CO2 20 - 29 mmol/L '23 23 22  ' Calcium 8.7 - 10.2 mg/dL 9.2 8.1(L) 8.1(L)  Total Protein 6.0 - 8.5 g/dL 7.3 - -  Total Bilirubin 0.0 - 1.2 mg/dL 0.8 - -  Alkaline Phos 39 - 117 IU/L 61 - -  AST 0 - 40 IU/L 16 - -  ALT 0 - 44 IU/L 11 - -   CBC Latest Ref Rng & Units 09/28/2019 05/01/2016 04/30/2016  WBC 3.4 - 10.8 x10E3/uL 5.6 7.8 6.6  Hemoglobin 13.0 - 17.7 g/dL 13.7 10.3(L) 9.4(L)  Hematocrit 37.5 - 51.0 % 41.0 31.7(L) 29.1(L)  Platelets 150 - 450 x10E3/uL 207 160 144(L)   Lipid Panel     Component Value Date/Time   CHOL 227 (H) 09/28/2019 0812   TRIG 109 09/28/2019 0812   HDL 39 (L) 09/28/2019 0812   CHOLHDL 6.8 07/24/2013 1025   VLDL 29 07/24/2013 1025   LDLCALC 168 (H) 09/28/2019 0812   LDLDIRECT 169 (H) 09/28/2019 0812   HEMOGLOBIN A1C Lab Results  Component Value Date   HGBA1C 6.3 (H) 09/28/2019   TSH No results for input(s): TSH in the last 8760 hours.  Labs 01/19/2018: HB 12.8/HCT 41.2, platelets 230, normal indicis. BUN 14, creatinine 1.09, serum glucose 84 mg, eGFR greater than 60 ML. LFTs normal. Total CPK 313, uric acid 8.4 mildly elevated.  Lipids not performed as patient is in a trial compared to 07/12/17, CPK reduced from 405 and stable from May 2018 where it was 321.  Medications and allergies   Allergies  Allergen Reactions   Crestor [Rosuvastatin Calcium] Other (See Comments)    Arthralgia   Lipitor [Atorvastatin Calcium] Other (See Comments)    Myalgia    Current Outpatient Medications  Medication Instructions   amLODipine (NORVASC) 10 mg, Oral, Daily   aspirin 81 mg, Oral, Every morning    carvedilol (COREG) 3.125 mg, Oral, 2 times daily with meals   ezetimibe (ZETIA) 10 MG tablet 1 tablet, Oral, Daily   losartan-hydrochlorothiazide (HYZAAR) 100-12.5 MG tablet 1 tablet, Oral, Daily   nitroGLYCERIN (NITROSTAT) 0.4 mg, Sublingual, Every 5 min PRN   pantoprazole (PROTONIX) 40 mg, Oral, 2 times daily before meals   Radiology:  No results found.  Cardiac Studies:   PCV MYOCARDIAL PERFUSION WO LEXISCAN 09/01/2020 Prior study not available for comparison. Exercise nuclear stress test was performed using Bruce protocol. Patient reached 10.5 METS, and 88% of age predicted maximum heart rate. Exercise capacity was excellent. Chest pain not reported. Normal heart rate, and hypertensive blood pressure response seen-peak BP 230/98 mmHg. Stress EKG revealed no ischemic changes. Stress LVEF calculated 45%, although visually appears normal. Low risk study.  [facility]/08/2020:  Left ventricular size, thickness and function. Ejection fraction greater than 55% by visual estimation. Left ventricular wall motion. Right ventricle is normal in size and function. Tissue Doppler sampling consistent with normal diastolic function. Doppler findings do not suggest pulmonary hypertension. No pericardial effusion. Essentially normal study.  Nuclear stress test  Exercise sestamibi stress 07/27/13: 1. Resting EKG showed normal sinus rhythm. Stress EKG was positive for ischemia. There was 2.5 mm additional ST depression noted in the inferior and anterior leads at peak exercise, which resolved at > 3 minutes into recovery. Patient exercised on BRUCE PROTOCOL for 5 minutes 42 seconds. The maximum work level achieved was 7.3 MET's. The test was terminated due to fatigue, chest pain and achievement of the target heart rate. 2. Perfusion images reveal reversible anterior and anteroapical ischemia. Dynamic gated images reveal the left ventricular ejection  fraction to be moderate to severely depressed and was estimated to be 37%. 3. This is a high risk study. Consider further cardiac work-up including cardiac catheterization.  Echocardiogram 07/30/13:  1. Left ventricular cavity is normal in size. Moderate concentric hypertrophy. Normal diastolic filling. Normal global wall motion. Normal systolic function. Calculated EF 61%.  Coronary angiogram 07/31/13:  Overlapping 4.0 x 61m, & 3.5 x 357mPromos Premier DES in prox. &  mid LAD. Other arteries did not have any significant lesions. Normal LV gram, EF- 55%.  EKG   EKG 10/28/2018: Sinus rhythm at a rate of 70 bpm.  Left atrial enlargement.  Left axis.  Nonspecific T wave abnormality.  No evidence of ischemia or injury pattern.  EKG 08/28/2020: Normal sinus rhythm at a rate of 72 bpm.  Normal axis.  No evidence of ischemia or injury.  Compared to EKG 09/07/2019, no significant change.  Assessment     ICD-10-CM   1. Pure hypercholesterolemia  E78.00   2. Essential hypertension  I10 EKG 12-Lead  3. Atherosclerosis of native coronary artery of native heart with stable angina pectoris ([place])  I25.118   4. Precordial pain  R07.2     No orders of the defined types were placed in this encounter.   Medications Discontinued During This Encounter  Medication Reason   Evolocumab (REPATHA) 140 MG/ML SOSY Error   Evolocumab SOAJ 140 mg      Recommendations:   Sean Strong  is a 55 y.o. African-American male with coronary artery disease status post proximal to mid LAD PCI 2014, history of duodenal ulcer secondary to H. pylori status post triple therapy treatment in 2017 with no recurrence, hyperlipidemia with statin intolerance, hypertension, chronically elevated CK enzymes, anxiety and depression and posttraumatic stress disorder, who is a veteran and therefore also followed at the [place]. Patient was previously enrolled in [place] trial for hyperlipidemia, however he dropped off.   Patient presents  for 8-week follow-up of hypertension, hyperlipidemia, and precordial pain.  He had presented to our office approximately 6 weeks ago for second opinion regarding precordial pain after his cardiologist at the [facility] did not recommend further evaluation.  Nuclear stress test was low risk showing normal myocardial perfusion and no ischemic changes on stress EKG. reviewed and discussed results of nuclear stress testing with patient, he verbalized understanding.  To hypertension, patient's blood pressure remains significantly elevated in the office.  Would like to make changes to his antihypertensive medications at this time, however is unsure of what medications he is presently taking.  Advised patient to call our office to complete medication reconciliation.  Once medications have been accurately updated, will likely make adjustments to patient's antihypertensive medications.  In regard to hyperlipidemia, patient will start previously prescribed Setia and plans to follow-up with his cardiologist at the [facility] for further management of hyperlipidemia.  Patient feels he has been unable to tolerate Repatha due to increased paresthesias in his hands bilaterally at night.  Patient does note symptoms today of intermittent blurry vision as well as right arm pain and bilateral paresthesias in his hands at night.  Patient has a history of intermittent blurry vision for which he is following with ophthalmology, has an appointment tomorrow.  Patient has no focal deficits on exam.  Suspicion for TIA/CVA is very low, however did counsel patient regarding signs and symptoms that would warrant emergent evaluation, he verbalized understanding agreement.  Patient plans to follow with providers at the [facility] regarding further cardiac care.  Patient will follow up our office as needed.    Alethia Berthold, PA-C 10/27/2020, 4:09 PM Office: [phone]

## 2020-10-28 DIAGNOSIS — Z0389 Encounter for observation for other suspected diseases and conditions ruled out: Secondary | ICD-10-CM | POA: Diagnosis not present

## 2020-10-28 DIAGNOSIS — I10 Essential (primary) hypertension: Secondary | ICD-10-CM | POA: Diagnosis not present

## 2020-10-29 DIAGNOSIS — I10 Essential (primary) hypertension: Secondary | ICD-10-CM | POA: Diagnosis not present

## 2020-11-04 DIAGNOSIS — R079 Chest pain, unspecified: Secondary | ICD-10-CM | POA: Diagnosis not present

## 2020-11-04 DIAGNOSIS — M25512 Pain in left shoulder: Secondary | ICD-10-CM | POA: Diagnosis not present

## 2020-11-04 DIAGNOSIS — I10 Essential (primary) hypertension: Secondary | ICD-10-CM | POA: Diagnosis not present

## 2020-11-11 DIAGNOSIS — H527 Unspecified disorder of refraction: Secondary | ICD-10-CM | POA: Diagnosis not present

## 2020-11-11 DIAGNOSIS — Z91128 Patient's intentional underdosing of medication regimen for other reason: Secondary | ICD-10-CM | POA: Diagnosis not present

## 2020-11-11 DIAGNOSIS — H25811 Combined forms of age-related cataract, right eye: Secondary | ICD-10-CM | POA: Diagnosis not present

## 2020-11-11 DIAGNOSIS — H2512 Age-related nuclear cataract, left eye: Secondary | ICD-10-CM | POA: Diagnosis not present

## 2020-11-11 DIAGNOSIS — T495X6A Underdosing of ophthalmological drugs and preparations, initial encounter: Secondary | ICD-10-CM | POA: Diagnosis not present

## 2020-11-11 DIAGNOSIS — H401113 Primary open-angle glaucoma, right eye, severe stage: Secondary | ICD-10-CM | POA: Diagnosis not present

## 2020-11-11 DIAGNOSIS — H401122 Primary open-angle glaucoma, left eye, moderate stage: Secondary | ICD-10-CM | POA: Diagnosis not present

## 2020-11-20 DIAGNOSIS — Z955 Presence of coronary angioplasty implant and graft: Secondary | ICD-10-CM | POA: Diagnosis not present

## 2020-11-20 DIAGNOSIS — I10 Essential (primary) hypertension: Secondary | ICD-10-CM | POA: Diagnosis not present

## 2020-11-20 DIAGNOSIS — I2510 Atherosclerotic heart disease of native coronary artery without angina pectoris: Secondary | ICD-10-CM | POA: Diagnosis not present

## 2020-11-21 DIAGNOSIS — I2510 Atherosclerotic heart disease of native coronary artery without angina pectoris: Secondary | ICD-10-CM | POA: Diagnosis not present

## 2020-11-21 DIAGNOSIS — F32A Depression, unspecified: Secondary | ICD-10-CM | POA: Diagnosis not present

## 2020-11-21 DIAGNOSIS — F4310 Post-traumatic stress disorder, unspecified: Secondary | ICD-10-CM | POA: Diagnosis not present

## 2020-11-21 DIAGNOSIS — E785 Hyperlipidemia, unspecified: Secondary | ICD-10-CM | POA: Diagnosis not present

## 2020-11-25 DIAGNOSIS — H9313 Tinnitus, bilateral: Secondary | ICD-10-CM | POA: Diagnosis not present

## 2020-11-25 DIAGNOSIS — H903 Sensorineural hearing loss, bilateral: Secondary | ICD-10-CM | POA: Diagnosis not present

## 2020-12-23 DIAGNOSIS — H401190 Primary open-angle glaucoma, unspecified eye, stage unspecified: Secondary | ICD-10-CM | POA: Diagnosis not present

## 2020-12-23 DIAGNOSIS — H04123 Dry eye syndrome of bilateral lacrimal glands: Secondary | ICD-10-CM | POA: Diagnosis not present

## 2020-12-23 DIAGNOSIS — H401122 Primary open-angle glaucoma, left eye, moderate stage: Secondary | ICD-10-CM | POA: Diagnosis not present

## 2020-12-23 DIAGNOSIS — H401113 Primary open-angle glaucoma, right eye, severe stage: Secondary | ICD-10-CM | POA: Diagnosis not present

## 2020-12-23 DIAGNOSIS — H25811 Combined forms of age-related cataract, right eye: Secondary | ICD-10-CM | POA: Diagnosis not present

## 2021-01-23 DIAGNOSIS — Z461 Encounter for fitting and adjustment of hearing aid: Secondary | ICD-10-CM | POA: Diagnosis not present

## 2021-01-23 DIAGNOSIS — H9313 Tinnitus, bilateral: Secondary | ICD-10-CM | POA: Diagnosis not present

## 2021-01-23 DIAGNOSIS — H903 Sensorineural hearing loss, bilateral: Secondary | ICD-10-CM | POA: Diagnosis not present

## 2021-02-24 DIAGNOSIS — H401113 Primary open-angle glaucoma, right eye, severe stage: Secondary | ICD-10-CM | POA: Diagnosis not present

## 2021-02-24 DIAGNOSIS — H2513 Age-related nuclear cataract, bilateral: Secondary | ICD-10-CM | POA: Diagnosis not present

## 2021-02-24 DIAGNOSIS — H04123 Dry eye syndrome of bilateral lacrimal glands: Secondary | ICD-10-CM | POA: Diagnosis not present

## 2021-02-24 DIAGNOSIS — H401122 Primary open-angle glaucoma, left eye, moderate stage: Secondary | ICD-10-CM | POA: Diagnosis not present

## 2021-04-28 DIAGNOSIS — M25511 Pain in right shoulder: Secondary | ICD-10-CM | POA: Diagnosis not present

## 2021-04-28 DIAGNOSIS — I2510 Atherosclerotic heart disease of native coronary artery without angina pectoris: Secondary | ICD-10-CM | POA: Diagnosis not present

## 2021-04-28 DIAGNOSIS — I10 Essential (primary) hypertension: Secondary | ICD-10-CM | POA: Diagnosis not present

## 2021-04-28 DIAGNOSIS — M79641 Pain in right hand: Secondary | ICD-10-CM | POA: Diagnosis not present

## 2021-04-28 DIAGNOSIS — L309 Dermatitis, unspecified: Secondary | ICD-10-CM | POA: Diagnosis not present

## 2021-04-28 DIAGNOSIS — R7303 Prediabetes: Secondary | ICD-10-CM | POA: Diagnosis not present

## 2021-04-28 DIAGNOSIS — M79642 Pain in left hand: Secondary | ICD-10-CM | POA: Diagnosis not present

## 2021-04-28 DIAGNOSIS — M19011 Primary osteoarthritis, right shoulder: Secondary | ICD-10-CM | POA: Diagnosis not present

## 2021-04-28 DIAGNOSIS — E785 Hyperlipidemia, unspecified: Secondary | ICD-10-CM | POA: Diagnosis not present

## 2021-05-26 DIAGNOSIS — H2513 Age-related nuclear cataract, bilateral: Secondary | ICD-10-CM | POA: Diagnosis not present

## 2021-05-26 DIAGNOSIS — H401113 Primary open-angle glaucoma, right eye, severe stage: Secondary | ICD-10-CM | POA: Diagnosis not present

## 2021-05-26 DIAGNOSIS — H04123 Dry eye syndrome of bilateral lacrimal glands: Secondary | ICD-10-CM | POA: Diagnosis not present

## 2021-05-26 DIAGNOSIS — H401122 Primary open-angle glaucoma, left eye, moderate stage: Secondary | ICD-10-CM | POA: Diagnosis not present

## 2021-06-04 DIAGNOSIS — E785 Hyperlipidemia, unspecified: Secondary | ICD-10-CM | POA: Diagnosis not present

## 2021-06-04 DIAGNOSIS — R1032 Left lower quadrant pain: Secondary | ICD-10-CM | POA: Diagnosis not present

## 2021-06-04 DIAGNOSIS — R9431 Abnormal electrocardiogram [ECG] [EKG]: Secondary | ICD-10-CM | POA: Diagnosis not present

## 2021-06-04 DIAGNOSIS — I2510 Atherosclerotic heart disease of native coronary artery without angina pectoris: Secondary | ICD-10-CM | POA: Diagnosis not present

## 2021-08-06 DIAGNOSIS — H2513 Age-related nuclear cataract, bilateral: Secondary | ICD-10-CM | POA: Diagnosis not present

## 2021-08-06 DIAGNOSIS — H401122 Primary open-angle glaucoma, left eye, moderate stage: Secondary | ICD-10-CM | POA: Diagnosis not present

## 2021-08-06 DIAGNOSIS — H401113 Primary open-angle glaucoma, right eye, severe stage: Secondary | ICD-10-CM | POA: Diagnosis not present

## 2021-08-06 DIAGNOSIS — H04123 Dry eye syndrome of bilateral lacrimal glands: Secondary | ICD-10-CM | POA: Diagnosis not present

## 2021-09-03 DIAGNOSIS — H04123 Dry eye syndrome of bilateral lacrimal glands: Secondary | ICD-10-CM | POA: Diagnosis not present

## 2021-09-03 DIAGNOSIS — H401113 Primary open-angle glaucoma, right eye, severe stage: Secondary | ICD-10-CM | POA: Diagnosis not present

## 2021-09-03 DIAGNOSIS — H401121 Primary open-angle glaucoma, left eye, mild stage: Secondary | ICD-10-CM | POA: Diagnosis not present

## 2021-09-03 DIAGNOSIS — H2513 Age-related nuclear cataract, bilateral: Secondary | ICD-10-CM | POA: Diagnosis not present

## 2021-10-06 DIAGNOSIS — L820 Inflamed seborrheic keratosis: Secondary | ICD-10-CM | POA: Diagnosis not present

## 2021-10-06 DIAGNOSIS — L218 Other seborrheic dermatitis: Secondary | ICD-10-CM | POA: Diagnosis not present

## 2021-10-08 DIAGNOSIS — R109 Unspecified abdominal pain: Secondary | ICD-10-CM | POA: Diagnosis not present

## 2021-10-14 DIAGNOSIS — H259 Unspecified age-related cataract: Secondary | ICD-10-CM | POA: Diagnosis not present

## 2021-10-14 DIAGNOSIS — H524 Presbyopia: Secondary | ICD-10-CM | POA: Diagnosis not present

## 2021-12-02 DIAGNOSIS — H04123 Dry eye syndrome of bilateral lacrimal glands: Secondary | ICD-10-CM | POA: Diagnosis not present

## 2021-12-02 DIAGNOSIS — H2513 Age-related nuclear cataract, bilateral: Secondary | ICD-10-CM | POA: Diagnosis not present

## 2021-12-02 DIAGNOSIS — H401121 Primary open-angle glaucoma, left eye, mild stage: Secondary | ICD-10-CM | POA: Diagnosis not present

## 2021-12-02 DIAGNOSIS — H401113 Primary open-angle glaucoma, right eye, severe stage: Secondary | ICD-10-CM | POA: Diagnosis not present

## 2021-12-18 DIAGNOSIS — I2510 Atherosclerotic heart disease of native coronary artery without angina pectoris: Secondary | ICD-10-CM | POA: Diagnosis not present

## 2021-12-18 DIAGNOSIS — R9431 Abnormal electrocardiogram [ECG] [EKG]: Secondary | ICD-10-CM | POA: Diagnosis not present

## 2021-12-18 DIAGNOSIS — E785 Hyperlipidemia, unspecified: Secondary | ICD-10-CM | POA: Diagnosis not present

## 2021-12-18 DIAGNOSIS — Z0389 Encounter for observation for other suspected diseases and conditions ruled out: Secondary | ICD-10-CM | POA: Diagnosis not present

## 2021-12-21 DIAGNOSIS — I2510 Atherosclerotic heart disease of native coronary artery without angina pectoris: Secondary | ICD-10-CM | POA: Diagnosis not present

## 2021-12-29 DIAGNOSIS — H401121 Primary open-angle glaucoma, left eye, mild stage: Secondary | ICD-10-CM | POA: Diagnosis not present

## 2021-12-29 DIAGNOSIS — H401113 Primary open-angle glaucoma, right eye, severe stage: Secondary | ICD-10-CM | POA: Diagnosis not present

## 2021-12-29 DIAGNOSIS — Z91199 Patient's noncompliance with other medical treatment and regimen due to unspecified reason: Secondary | ICD-10-CM | POA: Diagnosis not present

## 2022-01-04 DIAGNOSIS — Z91199 Patient's noncompliance with other medical treatment and regimen due to unspecified reason: Secondary | ICD-10-CM | POA: Diagnosis not present

## 2022-01-04 DIAGNOSIS — H401113 Primary open-angle glaucoma, right eye, severe stage: Secondary | ICD-10-CM | POA: Diagnosis not present

## 2022-01-04 DIAGNOSIS — G4730 Sleep apnea, unspecified: Secondary | ICD-10-CM | POA: Diagnosis not present

## 2022-01-04 DIAGNOSIS — H401112 Primary open-angle glaucoma, right eye, moderate stage: Secondary | ICD-10-CM | POA: Diagnosis not present

## 2022-02-01 DIAGNOSIS — H04123 Dry eye syndrome of bilateral lacrimal glands: Secondary | ICD-10-CM | POA: Diagnosis not present

## 2022-02-01 DIAGNOSIS — H401121 Primary open-angle glaucoma, left eye, mild stage: Secondary | ICD-10-CM | POA: Diagnosis not present

## 2022-02-01 DIAGNOSIS — H401113 Primary open-angle glaucoma, right eye, severe stage: Secondary | ICD-10-CM | POA: Diagnosis not present

## 2022-02-22 DIAGNOSIS — H401113 Primary open-angle glaucoma, right eye, severe stage: Secondary | ICD-10-CM | POA: Diagnosis not present

## 2022-02-22 DIAGNOSIS — H04123 Dry eye syndrome of bilateral lacrimal glands: Secondary | ICD-10-CM | POA: Diagnosis not present

## 2022-02-22 DIAGNOSIS — H401121 Primary open-angle glaucoma, left eye, mild stage: Secondary | ICD-10-CM | POA: Diagnosis not present

## 2022-02-22 DIAGNOSIS — Z87828 Personal history of other (healed) physical injury and trauma: Secondary | ICD-10-CM | POA: Diagnosis not present

## 2022-03-04 DIAGNOSIS — M7731 Calcaneal spur, right foot: Secondary | ICD-10-CM | POA: Diagnosis not present

## 2022-03-04 DIAGNOSIS — Z9861 Coronary angioplasty status: Secondary | ICD-10-CM | POA: Diagnosis not present

## 2022-03-04 DIAGNOSIS — R7302 Impaired glucose tolerance (oral): Secondary | ICD-10-CM | POA: Diagnosis not present

## 2022-03-04 DIAGNOSIS — M2012 Hallux valgus (acquired), left foot: Secondary | ICD-10-CM | POA: Diagnosis not present

## 2022-03-04 DIAGNOSIS — M7732 Calcaneal spur, left foot: Secondary | ICD-10-CM | POA: Diagnosis not present

## 2022-03-04 DIAGNOSIS — M25775 Osteophyte, left foot: Secondary | ICD-10-CM | POA: Diagnosis not present

## 2022-03-04 DIAGNOSIS — I2510 Atherosclerotic heart disease of native coronary artery without angina pectoris: Secondary | ICD-10-CM | POA: Diagnosis not present

## 2022-03-15 DIAGNOSIS — H401113 Primary open-angle glaucoma, right eye, severe stage: Secondary | ICD-10-CM | POA: Diagnosis not present

## 2022-03-15 DIAGNOSIS — H2513 Age-related nuclear cataract, bilateral: Secondary | ICD-10-CM | POA: Diagnosis not present

## 2022-03-15 DIAGNOSIS — H401121 Primary open-angle glaucoma, left eye, mild stage: Secondary | ICD-10-CM | POA: Diagnosis not present

## 2022-03-16 DIAGNOSIS — K05323 Chronic periodontitis, generalized, severe: Secondary | ICD-10-CM | POA: Diagnosis not present

## 2022-03-18 DIAGNOSIS — M79669 Pain in unspecified lower leg: Secondary | ICD-10-CM | POA: Diagnosis not present

## 2022-04-21 DIAGNOSIS — R600 Localized edema: Secondary | ICD-10-CM | POA: Diagnosis not present

## 2022-04-21 DIAGNOSIS — M2140 Flat foot [pes planus] (acquired), unspecified foot: Secondary | ICD-10-CM | POA: Diagnosis not present

## 2022-04-21 DIAGNOSIS — M722 Plantar fascial fibromatosis: Secondary | ICD-10-CM | POA: Diagnosis not present

## 2022-04-28 DIAGNOSIS — G4763 Sleep related bruxism: Secondary | ICD-10-CM | POA: Diagnosis not present

## 2022-04-28 DIAGNOSIS — R600 Localized edema: Secondary | ICD-10-CM | POA: Diagnosis not present

## 2022-05-03 DIAGNOSIS — H401113 Primary open-angle glaucoma, right eye, severe stage: Secondary | ICD-10-CM | POA: Diagnosis not present

## 2022-05-03 DIAGNOSIS — H401121 Primary open-angle glaucoma, left eye, mild stage: Secondary | ICD-10-CM | POA: Diagnosis not present

## 2022-06-08 DIAGNOSIS — H401121 Primary open-angle glaucoma, left eye, mild stage: Secondary | ICD-10-CM | POA: Diagnosis not present

## 2022-06-08 DIAGNOSIS — H2513 Age-related nuclear cataract, bilateral: Secondary | ICD-10-CM | POA: Diagnosis not present

## 2022-06-08 DIAGNOSIS — H401113 Primary open-angle glaucoma, right eye, severe stage: Secondary | ICD-10-CM | POA: Diagnosis not present

## 2022-06-08 DIAGNOSIS — H04123 Dry eye syndrome of bilateral lacrimal glands: Secondary | ICD-10-CM | POA: Diagnosis not present

## 2022-06-22 DIAGNOSIS — I2510 Atherosclerotic heart disease of native coronary artery without angina pectoris: Secondary | ICD-10-CM | POA: Diagnosis not present

## 2022-06-22 DIAGNOSIS — H401113 Primary open-angle glaucoma, right eye, severe stage: Secondary | ICD-10-CM | POA: Diagnosis not present

## 2022-06-22 DIAGNOSIS — I10 Essential (primary) hypertension: Secondary | ICD-10-CM | POA: Diagnosis not present

## 2022-06-22 DIAGNOSIS — Z01818 Encounter for other preprocedural examination: Secondary | ICD-10-CM | POA: Diagnosis not present

## 2022-07-06 DIAGNOSIS — E785 Hyperlipidemia, unspecified: Secondary | ICD-10-CM | POA: Diagnosis not present

## 2022-07-12 DIAGNOSIS — H401113 Primary open-angle glaucoma, right eye, severe stage: Secondary | ICD-10-CM | POA: Diagnosis not present

## 2022-07-12 DIAGNOSIS — H401121 Primary open-angle glaucoma, left eye, mild stage: Secondary | ICD-10-CM | POA: Diagnosis not present

## 2022-07-26 DIAGNOSIS — Z9883 Filtering (vitreous) bleb after glaucoma surgery status: Secondary | ICD-10-CM | POA: Diagnosis not present

## 2022-07-26 DIAGNOSIS — H401121 Primary open-angle glaucoma, left eye, mild stage: Secondary | ICD-10-CM | POA: Diagnosis not present

## 2022-07-26 DIAGNOSIS — H401113 Primary open-angle glaucoma, right eye, severe stage: Secondary | ICD-10-CM | POA: Diagnosis not present

## 2022-07-30 DIAGNOSIS — E785 Hyperlipidemia, unspecified: Secondary | ICD-10-CM | POA: Diagnosis not present

## 2022-08-24 DIAGNOSIS — H401121 Primary open-angle glaucoma, left eye, mild stage: Secondary | ICD-10-CM | POA: Diagnosis not present

## 2022-08-24 DIAGNOSIS — Z978 Presence of other specified devices: Secondary | ICD-10-CM | POA: Diagnosis not present

## 2022-08-24 DIAGNOSIS — H527 Unspecified disorder of refraction: Secondary | ICD-10-CM | POA: Diagnosis not present

## 2022-08-24 DIAGNOSIS — H401113 Primary open-angle glaucoma, right eye, severe stage: Secondary | ICD-10-CM | POA: Diagnosis not present

## 2022-08-24 DIAGNOSIS — H2513 Age-related nuclear cataract, bilateral: Secondary | ICD-10-CM | POA: Diagnosis not present

## 2022-08-24 DIAGNOSIS — Z48810 Encounter for surgical aftercare following surgery on the sense organs: Secondary | ICD-10-CM | POA: Diagnosis not present

## 2022-09-10 DIAGNOSIS — J069 Acute upper respiratory infection, unspecified: Secondary | ICD-10-CM | POA: Diagnosis not present

## 2022-09-29 DIAGNOSIS — J069 Acute upper respiratory infection, unspecified: Secondary | ICD-10-CM | POA: Diagnosis not present

## 2022-09-29 DIAGNOSIS — U071 COVID-19: Secondary | ICD-10-CM | POA: Diagnosis not present

## 2022-09-29 DIAGNOSIS — Z20822 Contact with and (suspected) exposure to covid-19: Secondary | ICD-10-CM | POA: Diagnosis not present

## 2022-09-29 DIAGNOSIS — H11431 Conjunctival hyperemia, right eye: Secondary | ICD-10-CM | POA: Diagnosis not present

## 2022-10-04 DIAGNOSIS — Z125 Encounter for screening for malignant neoplasm of prostate: Secondary | ICD-10-CM | POA: Diagnosis not present

## 2022-10-04 DIAGNOSIS — E785 Hyperlipidemia, unspecified: Secondary | ICD-10-CM | POA: Diagnosis not present

## 2022-10-04 DIAGNOSIS — I10 Essential (primary) hypertension: Secondary | ICD-10-CM | POA: Diagnosis not present

## 2022-10-04 DIAGNOSIS — H04123 Dry eye syndrome of bilateral lacrimal glands: Secondary | ICD-10-CM | POA: Diagnosis not present

## 2022-10-04 DIAGNOSIS — H401121 Primary open-angle glaucoma, left eye, mild stage: Secondary | ICD-10-CM | POA: Diagnosis not present

## 2022-10-04 DIAGNOSIS — H401113 Primary open-angle glaucoma, right eye, severe stage: Secondary | ICD-10-CM | POA: Diagnosis not present

## 2022-10-04 DIAGNOSIS — R7302 Impaired glucose tolerance (oral): Secondary | ICD-10-CM | POA: Diagnosis not present

## 2022-10-04 DIAGNOSIS — H2513 Age-related nuclear cataract, bilateral: Secondary | ICD-10-CM | POA: Diagnosis not present

## 2022-10-18 DIAGNOSIS — H401113 Primary open-angle glaucoma, right eye, severe stage: Secondary | ICD-10-CM | POA: Diagnosis not present

## 2022-10-18 DIAGNOSIS — H401121 Primary open-angle glaucoma, left eye, mild stage: Secondary | ICD-10-CM | POA: Diagnosis not present

## 2022-10-18 DIAGNOSIS — H524 Presbyopia: Secondary | ICD-10-CM | POA: Diagnosis not present

## 2022-10-18 DIAGNOSIS — H2513 Age-related nuclear cataract, bilateral: Secondary | ICD-10-CM | POA: Diagnosis not present

## 2022-10-22 DIAGNOSIS — H401113 Primary open-angle glaucoma, right eye, severe stage: Secondary | ICD-10-CM | POA: Diagnosis not present

## 2022-10-22 DIAGNOSIS — H2513 Age-related nuclear cataract, bilateral: Secondary | ICD-10-CM | POA: Diagnosis not present

## 2022-10-22 DIAGNOSIS — H401121 Primary open-angle glaucoma, left eye, mild stage: Secondary | ICD-10-CM | POA: Diagnosis not present

## 2022-10-22 DIAGNOSIS — H527 Unspecified disorder of refraction: Secondary | ICD-10-CM | POA: Diagnosis not present

## 2022-12-28 DIAGNOSIS — H401113 Primary open-angle glaucoma, right eye, severe stage: Secondary | ICD-10-CM | POA: Diagnosis not present

## 2022-12-28 DIAGNOSIS — H401121 Primary open-angle glaucoma, left eye, mild stage: Secondary | ICD-10-CM | POA: Diagnosis not present

## 2022-12-28 DIAGNOSIS — H269 Unspecified cataract: Secondary | ICD-10-CM | POA: Diagnosis not present

## 2022-12-28 DIAGNOSIS — H527 Unspecified disorder of refraction: Secondary | ICD-10-CM | POA: Diagnosis not present

## 2023-01-19 DIAGNOSIS — R519 Headache, unspecified: Secondary | ICD-10-CM | POA: Diagnosis not present

## 2023-01-19 DIAGNOSIS — H9202 Otalgia, left ear: Secondary | ICD-10-CM | POA: Diagnosis not present

## 2023-01-19 DIAGNOSIS — L293 Anogenital pruritus, unspecified: Secondary | ICD-10-CM | POA: Diagnosis not present

## 2023-03-01 DIAGNOSIS — H401122 Primary open-angle glaucoma, left eye, moderate stage: Secondary | ICD-10-CM | POA: Diagnosis not present

## 2023-03-01 DIAGNOSIS — H401113 Primary open-angle glaucoma, right eye, severe stage: Secondary | ICD-10-CM | POA: Diagnosis not present

## 2023-03-01 DIAGNOSIS — H269 Unspecified cataract: Secondary | ICD-10-CM | POA: Diagnosis not present

## 2023-03-01 DIAGNOSIS — H527 Unspecified disorder of refraction: Secondary | ICD-10-CM | POA: Diagnosis not present

## 2023-03-07 DIAGNOSIS — R42 Dizziness and giddiness: Secondary | ICD-10-CM | POA: Diagnosis not present

## 2023-03-07 DIAGNOSIS — R9082 White matter disease, unspecified: Secondary | ICD-10-CM | POA: Diagnosis not present

## 2023-03-07 DIAGNOSIS — R200 Anesthesia of skin: Secondary | ICD-10-CM | POA: Diagnosis not present

## 2023-03-07 DIAGNOSIS — R202 Paresthesia of skin: Secondary | ICD-10-CM | POA: Diagnosis not present

## 2023-03-07 DIAGNOSIS — H538 Other visual disturbances: Secondary | ICD-10-CM | POA: Diagnosis not present

## 2023-03-28 DIAGNOSIS — H8110 Benign paroxysmal vertigo, unspecified ear: Secondary | ICD-10-CM | POA: Diagnosis not present

## 2023-03-28 DIAGNOSIS — I2510 Atherosclerotic heart disease of native coronary artery without angina pectoris: Secondary | ICD-10-CM | POA: Diagnosis not present

## 2023-03-28 DIAGNOSIS — I10 Essential (primary) hypertension: Secondary | ICD-10-CM | POA: Diagnosis not present

## 2023-03-28 DIAGNOSIS — Z955 Presence of coronary angioplasty implant and graft: Secondary | ICD-10-CM | POA: Diagnosis not present

## 2023-03-28 DIAGNOSIS — E785 Hyperlipidemia, unspecified: Secondary | ICD-10-CM | POA: Diagnosis not present

## 2023-03-28 DIAGNOSIS — Z7982 Long term (current) use of aspirin: Secondary | ICD-10-CM | POA: Diagnosis not present

## 2023-04-26 DIAGNOSIS — R2681 Unsteadiness on feet: Secondary | ICD-10-CM | POA: Diagnosis not present

## 2023-04-26 DIAGNOSIS — R42 Dizziness and giddiness: Secondary | ICD-10-CM | POA: Diagnosis not present

## 2023-04-26 DIAGNOSIS — F22 Delusional disorders: Secondary | ICD-10-CM | POA: Diagnosis not present

## 2023-05-31 DIAGNOSIS — G4730 Sleep apnea, unspecified: Secondary | ICD-10-CM | POA: Diagnosis not present

## 2023-05-31 DIAGNOSIS — H401121 Primary open-angle glaucoma, left eye, mild stage: Secondary | ICD-10-CM | POA: Diagnosis not present

## 2023-05-31 DIAGNOSIS — H401113 Primary open-angle glaucoma, right eye, severe stage: Secondary | ICD-10-CM | POA: Diagnosis not present

## 2023-05-31 DIAGNOSIS — H269 Unspecified cataract: Secondary | ICD-10-CM | POA: Diagnosis not present

## 2023-07-25 DIAGNOSIS — R040 Epistaxis: Secondary | ICD-10-CM | POA: Diagnosis not present

## 2023-07-25 DIAGNOSIS — J329 Chronic sinusitis, unspecified: Secondary | ICD-10-CM | POA: Diagnosis not present

## 2023-07-25 DIAGNOSIS — R059 Cough, unspecified: Secondary | ICD-10-CM | POA: Diagnosis not present

## 2023-07-25 DIAGNOSIS — J029 Acute pharyngitis, unspecified: Secondary | ICD-10-CM | POA: Diagnosis not present

## 2024-01-05 ENCOUNTER — Emergency Department (HOSPITAL_COMMUNITY)

## 2024-01-05 ENCOUNTER — Other Ambulatory Visit: Payer: Self-pay

## 2024-01-05 ENCOUNTER — Encounter (HOSPITAL_COMMUNITY): Payer: Self-pay | Admitting: *Deleted

## 2024-01-05 ENCOUNTER — Emergency Department (HOSPITAL_COMMUNITY): Admission: EM | Admit: 2024-01-05 | Discharge: 2024-01-05 | Disposition: A | Discharge status: Home / Self Care

## 2024-01-05 DIAGNOSIS — R930 Abnormal findings on diagnostic imaging of skull and head, not elsewhere classified: Secondary | ICD-10-CM | POA: Diagnosis not present

## 2024-01-05 DIAGNOSIS — Z7982 Long term (current) use of aspirin: Secondary | ICD-10-CM | POA: Diagnosis not present

## 2024-01-05 DIAGNOSIS — H518 Other specified disorders of binocular movement: Secondary | ICD-10-CM | POA: Diagnosis present

## 2024-01-05 DIAGNOSIS — Z79899 Other long term (current) drug therapy: Secondary | ICD-10-CM | POA: Insufficient documentation

## 2024-01-05 LAB — CBC
HCT: 40.3 % (ref 39.0–52.0)
Hemoglobin: 13.3 g/dL (ref 13.0–17.0)
MCH: 24.1 pg — ABNORMAL LOW (ref 26.0–34.0)
MCHC: 33 g/dL (ref 30.0–36.0)
MCV: 73 fL — ABNORMAL LOW (ref 80.0–100.0)
Platelets: 217 10*3/uL (ref 150–400)
RBC: 5.52 MIL/uL (ref 4.22–5.81)
RDW: 15.7 % — ABNORMAL HIGH (ref 11.5–15.5)
WBC: 5.6 10*3/uL (ref 4.0–10.5)
nRBC: 0 % (ref 0.0–0.2)

## 2024-01-05 LAB — I-STAT CHEM 8, ED
BUN: 14 mg/dL (ref 6–20)
Calcium, Ion: 1.16 mmol/L (ref 1.15–1.40)
Chloride: 102 mmol/L (ref 98–111)
Creatinine, Ser: 1.2 mg/dL (ref 0.61–1.24)
Glucose, Bld: 102 mg/dL — ABNORMAL HIGH (ref 70–99)
HCT: 43 % (ref 39.0–52.0)
Hemoglobin: 14.6 g/dL (ref 13.0–17.0)
Potassium: 3.4 mmol/L — ABNORMAL LOW (ref 3.5–5.1)
Sodium: 137 mmol/L (ref 135–145)
TCO2: 22 mmol/L (ref 22–32)

## 2024-01-05 LAB — DIFFERENTIAL
Abs Immature Granulocytes: 0.01 10*3/uL (ref 0.00–0.07)
Basophils Absolute: 0 10*3/uL (ref 0.0–0.1)
Basophils Relative: 1 %
Eosinophils Absolute: 0.2 10*3/uL (ref 0.0–0.5)
Eosinophils Relative: 4 %
Immature Granulocytes: 0 %
Lymphocytes Relative: 48 %
Lymphs Abs: 2.7 10*3/uL (ref 0.7–4.0)
Monocytes Absolute: 0.3 10*3/uL (ref 0.1–1.0)
Monocytes Relative: 6 %
Neutro Abs: 2.3 10*3/uL (ref 1.7–7.7)
Neutrophils Relative %: 41 %

## 2024-01-05 LAB — COMPREHENSIVE METABOLIC PANEL WITH GFR
ALT: 16 U/L (ref 0–44)
AST: 21 U/L (ref 15–41)
Albumin: 3.9 g/dL (ref 3.5–5.0)
Alkaline Phosphatase: 52 U/L (ref 38–126)
Anion gap: 11 (ref 5–15)
BUN: 12 mg/dL (ref 6–20)
CO2: 22 mmol/L (ref 22–32)
Calcium: 9.3 mg/dL (ref 8.9–10.3)
Chloride: 103 mmol/L (ref 98–111)
Creatinine, Ser: 1.12 mg/dL (ref 0.61–1.24)
GFR, Estimated: >60 mL/min (ref >60)
Glucose, Bld: 99 mg/dL (ref 70–99)
Potassium: 3.4 mmol/L — ABNORMAL LOW (ref 3.5–5.1)
Sodium: 136 mmol/L (ref 135–145)
Total Bilirubin: 0.8 mg/dL (ref 0.0–1.2)
Total Protein: 7.1 g/dL (ref 6.5–8.1)

## 2024-01-05 LAB — ETHANOL: Alcohol, Ethyl (B): <15 mg/dL (ref <15)

## 2024-01-05 LAB — CBG MONITORING, ED: Glucose-Capillary: 112 mg/dL — ABNORMAL HIGH (ref 70–99)

## 2024-01-05 MED ORDER — GADOBUTROL 1 MMOL/ML IV SOLN
10.0000 mL | Freq: Once | INTRAVENOUS | Status: AC | PRN
  Administered 2024-01-05: 10 mL via INTRAVENOUS
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
## 2024-01-05 NOTE — ED Triage Notes (Signed)
 Pt arrives POV due to soreness in right arm along with weakness which began 24 hours ago.  Pt also states that his opthomologist at [facility] told him to get further evaluation after she looked in his eye.  Pt ambulatory to triage.

## 2024-01-05 NOTE — ED Provider Notes (Signed)
 [place] EMERGENCY DEPARTMENT AT [facility] Provider Note   CSN: [id number] Arrival date & time: 01/05/24  1434     History  Chief Complaint  Patient presents with   Eye Problem    Disconjugate gaze    Sean Strong is a 58 y.o. male.  Patient is a 58 year old male who presents to the emergency department with a chief complaint of disconjugate gaze to his right eye.  He notes that he was evaluated by ophthalmology earlier today and was sent to the emergency department for further evaluation.  He is also complaining of some pain to his right arm along the lateral aspect which has been ongoing for 3 days.  He denies any chest pain or shortness of breath.  He denies any weakness to bilateral upper and lower extremities.  He denies any changes in speech.  He has had no abnormal headaches and denies any recent falls or blunt head trauma.  He has had no associated dizziness, lightheadedness or syncope.  He denies any associated visual changes.  He notes that he does have "a device in his right eye to drain the pressure".  He denies any active pain to his eye at this point and notes that his pressures were within normal limits at his ophthalmologist office.   Eye Problem      Home Medications Prior to Admission medications   Medication Sig Start Date End Date Taking? Authorizing Provider  amLODipine  (NORVASC ) 10 MG tablet Take 1 tablet (10 mg total) by mouth daily. 08/28/20   Cantwell, Celeste C, PA-C  aspirin  81 MG chewable tablet Chew 81 mg by mouth every morning.     [provider]  carvedilol  (COREG ) 6.25 MG tablet Take 3.125 mg by mouth 2 (two) times daily with a meal.     [provider]  ezetimibe (ZETIA) 10 MG tablet Take 1 tablet by mouth daily. 07/25/20   [provider]  losartan -hydrochlorothiazide (HYZAAR) 100-12.5 MG tablet Take 1 tablet by mouth daily. 12/15/18   Knox Perl, MD  nitroGLYCERIN  (NITROSTAT ) 0.4 MG SL tablet Place 0.4 mg  under the tongue every 5 (five) minutes as needed for chest pain.    [provider]  pantoprazole  (PROTONIX ) 40 MG tablet Take 1 tablet (40 mg total) by mouth 2 (two) times daily before a meal. 05/01/16   Alfonse Angle, Benuel Brazier, MD      Allergies    Crestor  [rosuvastatin  calcium ] and Lipitor [atorvastatin  calcium ]    Review of Systems   Review of Systems  HENT:         Disconjugate gaze to right eye  All other systems reviewed and are negative.   Physical Exam Updated Vital Signs BP 133/88   Pulse 70   Temp 98.1 F (36.7 C)   Resp 20   SpO2 99%  Physical Exam Vitals and nursing note reviewed.  Constitutional:      Appearance: Normal appearance.  HENT:     Head: Normocephalic and atraumatic.     Nose: Nose normal.     Mouth/Throat:     Mouth: Mucous membranes are moist.  Eyes:     Extraocular Movements: Extraocular movements intact.     Conjunctiva/sclera: Conjunctivae normal.     Pupils: Pupils are equal, round, and reactive to light.  Cardiovascular:     Rate and Rhythm: Normal rate and regular rhythm.     Pulses: Normal pulses.     Heart sounds: Normal heart sounds. No murmur heard.  No gallop.  Pulmonary:     Effort: Pulmonary effort is normal. No respiratory distress.     Breath sounds: Normal breath sounds. No stridor. No wheezing or rales.  Abdominal:     General: Abdomen is flat. Bowel sounds are normal. There is no distension.     Palpations: Abdomen is soft.     Tenderness: There is no abdominal tenderness. There is no guarding.  Musculoskeletal:        General: No swelling, tenderness, deformity or signs of injury. Normal range of motion.     Cervical back: Normal range of motion and neck supple. No rigidity or tenderness.     Right lower leg: No edema.     Left lower leg: No edema.  Lymphadenopathy:     Cervical: No cervical adenopathy.  Skin:    General: Skin is warm and dry.  Neurological:     General: No focal deficit present.     Mental  Status: He is alert and oriented to person, place, and time. Mental status is at baseline.     Cranial Nerves: No cranial nerve deficit.     Sensory: No sensory deficit.     Motor: No weakness.     Coordination: Coordination normal.     Gait: Gait normal.     Comments: Difficulty with tracking to right eye  Psychiatric:        Mood and Affect: Mood normal.        Behavior: Behavior normal.        Thought Content: Thought content normal.        Judgment: Judgment normal.     ED Results / Procedures / Treatments   Labs (all labs ordered are listed, but only abnormal results are displayed) Labs Reviewed  CBC - Abnormal; Notable for the following components:      Result Value   MCV 73.0 (*)    MCH 24.1 (*)    RDW 15.7 (*)    All other components within normal limits  COMPREHENSIVE METABOLIC PANEL WITH GFR - Abnormal; Notable for the following components:   Potassium 3.4 (*)    All other components within normal limits  I-STAT CHEM 8, ED - Abnormal; Notable for the following components:   Potassium 3.4 (*)    Glucose, Bld 102 (*)    All other components within normal limits  CBG MONITORING, ED - Abnormal; Notable for the following components:   Glucose-Capillary 112 (*)    All other components within normal limits  ETHANOL  DIFFERENTIAL  RAPID URINE DRUG SCREEN, HOSP PERFORMED    EKG EKG Interpretation Date/Time:  Thursday Jan 05 2024 14:47:35 EDT Ventricular Rate:  71 PR Interval:  194 QRS Duration:  86 QT Interval:  418 QTC Calculation: 454 R Axis:   -5  Text Interpretation: Normal sinus rhythm Normal ECG When compared with ECG of 27-Apr-2016 05:28, PREVIOUS ECG IS PRESENT Confirmed by Elise Guile [phone]) on 01/05/2024 3:33:50 PM  Radiology No results found.  Procedures Procedures    Medications Ordered in ED Medications - No data to display  ED Course/ Medical Decision Making/ A&P                                 Medical Decision Making Amount and/or  Complexity of Data Reviewed Radiology: ordered.  Risk Prescription drug management.   This patient presents to the ED for concern of disconjugate gaze differential diagnosis  includes CVA, TIA, intracranial hemorrhage, optic neuritis, glaucoma    Additional history obtained:  Additional history obtained from none External records from outside source obtained and reviewed including medical records   Lab Tests:  I Ordered, and personally interpreted labs.  The pertinent results include: No leukocytosis, no anemia, normal electrolytes, normal kidney function and liver function   Imaging Studies ordered:  I ordered imaging studies including MRI of brain and orbits, CT scan of the head I independently visualized and interpreted imaging which showed no acute intracranial process, no ischemic changes I agree with the radiologist interpretation    Problem List / ED Course:  Patient is doing well at this time and is stable for discharge home.  Discussed with patient that MRIs have been completely unremarkable.  Do not suspect CVA or TIA at this point.  Neuroexam is otherwise completely unremarkable.  Blood work has been unremarkable as well.  He has no signs of acute hemorrhage at this point.  He has no other acute complaints in the emergency department.  Discussed with patient importance of continued close follow-up with his ophthalmologist on an outpatient basis.  Strict return precautions were discussed for any new or worsening symptoms.  Patient voiced understanding and had no additional questions. Patient case was fully discussed with attending physician who is in agreement to plan at this time.     Social Determinants of Health:  none           Final Clinical Impression(s) / ED Diagnoses Final diagnoses:  None    Rx / DC Orders ED Discharge Orders     None         Roselynn Connors, PA-C 01/05/24 2209    Rolinda Climes, DO 01/05/24 2300

## 2024-01-05 NOTE — Discharge Instructions (Signed)
 Please follow-up closely with your eye doctor on an outpatient basis.  Return to the emergency department immediately for any new or worsening symptoms.
